# Patient Record
Sex: Female | Born: 1965 | Race: White | Hispanic: No | Marital: Married | Smoking: Never smoker
Health system: Southern US, Community
[De-identification: ages and names within clinical notes are randomized; demographics above are authoritative.]

## PROBLEM LIST (undated history)

## (undated) DIAGNOSIS — F419 Anxiety disorder, unspecified: Secondary | ICD-10-CM

## (undated) DIAGNOSIS — D249 Benign neoplasm of unspecified breast: Secondary | ICD-10-CM

## (undated) HISTORY — PX: WISDOM TOOTH EXTRACTION: SHX21

## (undated) HISTORY — PX: BREAST BIOPSY: SHX20

## (undated) HISTORY — PX: DIAGNOSTIC LAPAROSCOPY: SUR761

## (undated) HISTORY — DX: Anxiety disorder, unspecified: F41.9

## (undated) HISTORY — PX: PELVIC LAPAROSCOPY: SHX162

## (undated) HISTORY — PX: BREAST EXCISIONAL BIOPSY: SUR124

## (undated) HISTORY — DX: Benign neoplasm of unspecified breast: D24.9

---

## 1998-05-08 DIAGNOSIS — D249 Benign neoplasm of unspecified breast: Secondary | ICD-10-CM

## 1998-05-08 HISTORY — PX: BREAST FIBROADENOMA SURGERY: SHX580

## 1998-05-08 HISTORY — DX: Benign neoplasm of unspecified breast: D24.9

## 2004-10-12 ENCOUNTER — Ambulatory Visit: Payer: Self-pay | Admitting: Obstetrics and Gynecology

## 2006-01-31 ENCOUNTER — Ambulatory Visit: Payer: Self-pay | Admitting: Obstetrics and Gynecology

## 2006-08-15 ENCOUNTER — Ambulatory Visit: Payer: Self-pay | Admitting: Internal Medicine

## 2006-08-22 ENCOUNTER — Ambulatory Visit: Payer: Self-pay | Admitting: Internal Medicine

## 2007-02-06 ENCOUNTER — Ambulatory Visit: Payer: Self-pay | Admitting: Unknown Physician Specialty

## 2007-05-09 HISTORY — PX: CRYOABLATION: SHX1415

## 2007-10-04 ENCOUNTER — Ambulatory Visit: Payer: Self-pay | Admitting: Unknown Physician Specialty

## 2007-10-08 ENCOUNTER — Ambulatory Visit: Payer: Self-pay | Admitting: Unknown Physician Specialty

## 2008-02-20 ENCOUNTER — Ambulatory Visit: Payer: Self-pay | Admitting: Unknown Physician Specialty

## 2009-02-24 ENCOUNTER — Ambulatory Visit: Payer: Self-pay | Admitting: Unknown Physician Specialty

## 2010-03-15 ENCOUNTER — Ambulatory Visit: Payer: Self-pay | Admitting: Unknown Physician Specialty

## 2010-11-28 ENCOUNTER — Ambulatory Visit: Payer: Self-pay | Admitting: Unknown Physician Specialty

## 2010-12-06 ENCOUNTER — Ambulatory Visit: Payer: Self-pay | Admitting: Surgery

## 2010-12-08 LAB — PATHOLOGY REPORT

## 2011-07-20 ENCOUNTER — Ambulatory Visit: Payer: Self-pay | Admitting: Surgery

## 2013-12-02 ENCOUNTER — Ambulatory Visit (INDEPENDENT_AMBULATORY_CARE_PROVIDER_SITE_OTHER): Payer: PRIVATE HEALTH INSURANCE | Admitting: General Surgery

## 2013-12-02 ENCOUNTER — Encounter: Payer: Self-pay | Admitting: General Surgery

## 2013-12-02 ENCOUNTER — Other Ambulatory Visit: Payer: PRIVATE HEALTH INSURANCE

## 2013-12-02 VITALS — BP 130/90 | HR 74 | Resp 14 | Ht 68.0 in | Wt 185.0 lb

## 2013-12-02 DIAGNOSIS — N63 Unspecified lump in unspecified breast: Secondary | ICD-10-CM

## 2013-12-02 DIAGNOSIS — D241 Benign neoplasm of right breast: Secondary | ICD-10-CM

## 2013-12-02 DIAGNOSIS — D249 Benign neoplasm of unspecified breast: Secondary | ICD-10-CM

## 2013-12-02 NOTE — Progress Notes (Signed)
Patient ID: Vanessa Hopkins, female   DOB: Vanessa Hopkins 15, 1967, 48 y.o.   MRN: [id number]  Chief Complaint  Patient presents with  . Other    New Patient evaluation of right breast mass    HPI Vanessa Hopkins is a 48 y.o. female who presents for an evaluation of a right breast mass. The patient's last mammogram was done at [facility] in November 2014. The mass was noticed approximately 4 years ago. Dr. Rochel Brome and the patient have kept an eye on this yearly. This area was biopsied in 2012 and was benign.. She has had a fibroadenoma removed from another area in the right breast previously. No known family history of breast problems. The patient does do self breast checks regularly and gets regular mammograms. She does complain of tenderness that is described as a needle prick on occasion in the area of the known fibroadenoma, and it was cyst change that had prompted her to present for reassessment. The mass is located in the upper central quadrant of the right breast. She believes that it has gotten larger. The area feels flat in shape.   HPI  Past Medical History  Diagnosis Date  . Fibroadenoma of breast 2000    Past Surgical History  Procedure Laterality Date  . Breast fibroadenoma surgery Right 2000  . Cryoablation  2009  . Breast biopsy Right 2012?  Marland Kitchen Diagnostic laparoscopy  2012?    History reviewed. No pertinent family history.  Social History History  Substance Use Topics  . Smoking status: Never Smoker   . Smokeless tobacco: Not on file  . Alcohol Use: Yes    No Known Allergies  Current Outpatient Prescriptions  Medication Sig Dispense Refill  . B Complex Vitamins (VITAMIN B COMPLEX PO) Take 1 tablet by mouth daily.      Cyndie Chime Estrad-Fe Biphas (LO LOESTRIN FE PO) Take by mouth.      . Omega-3 Fatty Acids (FISH OIL PO) Take by mouth.      Marland Kitchen VITAMIN D, ERGOCALCIFEROL, PO Take 1 capsule by mouth daily.       No current facility-administered medications for this visit.     Review of Systems Review of Systems  Constitutional: Negative.   Respiratory: Negative.   Cardiovascular: Negative.     Blood pressure 130/90, pulse 74, resp. rate 14, height 5\' 8"  (1.727 m), weight 185 lb (83.915 kg).  Physical Exam Physical Exam  Constitutional: She is oriented to person, place, and time. She appears well-developed and well-nourished.  Neck: Neck supple. No thyromegaly present.  Cardiovascular: Normal rate, regular rhythm and normal heart sounds.   No murmur heard. Pulmonary/Chest: Effort normal and breath sounds normal. Right breast exhibits mass. Right breast exhibits no inverted nipple, no nipple discharge, no skin change and no tenderness. Left breast exhibits no inverted nipple, no mass, no nipple discharge, no skin change and no tenderness.  12 o'clock right breast 5 cm from nipple 3 cm thickening.   Lymphadenopathy:    She has no cervical adenopathy.    She has no axillary adenopathy.  Neurological: She is alert and oriented to person, place, and time.  Skin: Skin is warm and dry.    Data Reviewed Bilateral mammograms completed at [place] dated 03/12/2013 reported dense breast. The previously noted nodular opacity in the right breast was unchanged. BI-RAD-2.  Ultrasound examination of the right breast at the 12:00 position 5 cm from the nipple showed a 1.35 x 1.71 x 2.49 cm smoothly marginated  hypoechoic mass with acoustic enhancement. Clear edge affect. No internal vascular flow. This is consistent with a fibroadenoma. BI-RAD-2.  Outside ultrasound completed in September 2013 show the area measuring 1.35 x 2.14 x 2.4 to and in July 2012 1.1 x 1.9 x 2.2 cm. Minimal interval change post 2012 biopsy.  Assessment    Benign breast exam. Stable fibroadenoma.    Plan    The staining pain reported by the patient [place] or [place] not be related to the fibroadenoma. If fairly modest at present and does not warrant formal excision. The patient should  continue annual screening mammograms with her gynecologist. Should she have worsening symptoms or increasing local discomfort excision or cryotherapy could be considered.      PCP: No Pcp Per Patient Ref. MD: Dr. Percell Boston, [place] 12/04/2013, 12:06 PM

## 2013-12-02 NOTE — Patient Instructions (Signed)
Patient to return in as needed. The patient is aware to call back for any questions or concerns.

## 2013-12-04 DIAGNOSIS — D249 Benign neoplasm of unspecified breast: Secondary | ICD-10-CM | POA: Insufficient documentation

## 2014-03-09 ENCOUNTER — Encounter: Payer: Self-pay | Admitting: General Surgery

## 2014-12-04 ENCOUNTER — Encounter: Payer: Self-pay | Admitting: Certified Nurse Midwife

## 2014-12-04 ENCOUNTER — Ambulatory Visit (INDEPENDENT_AMBULATORY_CARE_PROVIDER_SITE_OTHER): Payer: PRIVATE HEALTH INSURANCE | Admitting: Certified Nurse Midwife

## 2014-12-04 VITALS — BP 122/70 | HR 70 | Resp 20 | Ht 67.25 in | Wt 185.0 lb

## 2014-12-04 DIAGNOSIS — Z124 Encounter for screening for malignant neoplasm of cervix: Secondary | ICD-10-CM | POA: Diagnosis not present

## 2014-12-04 DIAGNOSIS — Z0000 Encounter for general adult medical examination without abnormal findings: Secondary | ICD-10-CM | POA: Diagnosis not present

## 2014-12-04 DIAGNOSIS — N6310 Unspecified lump in the right breast, unspecified quadrant: Secondary | ICD-10-CM

## 2014-12-04 DIAGNOSIS — N63 Unspecified lump in breast: Secondary | ICD-10-CM

## 2014-12-04 DIAGNOSIS — Z01419 Encounter for gynecological examination (general) (routine) without abnormal findings: Secondary | ICD-10-CM

## 2014-12-04 NOTE — Progress Notes (Signed)
49 y.o. G42P4 Married  Caucasian Fe here to establish gyn care and  for annual exam. Patient had ablation for menorrhagia and prolonged bleeding. No bleeding at all since ablation. Patient has not any menses like symptoms. Previous Gyn in [place] has retired. Was started on Loloestrin for menopausal symptoms, she feels better, with using OCP. Had [facility] with previous MD and menopausal was noted. Patient desires fasting labs, but would like to schedule to come in for. patient has history of fibroadenoma, one removed. Has one in right breast with last follow up ? 2014. She feels has enlarged. Has not established with PCP, uses Urgent care. Returning to career with teaching soon. Patient concerned with OCP instead of HRT. Friends have discussed with her patch use is better. Doing well with  Her routine. No other health issues today.  No LMP recorded. Patient has had an ablation.          Sexually active: Yes.    The current method of family planning is ablation.    Exercising: Yes.    low impact cardio, treadmill, eliptical, bike Smoker:  no  Health Maintenance: Pap:  2015 neg  MMG:  11/14 then u/s done  Has fibroadenoma that is being followed. Colonoscopy:  none BMD:   none TDaP:  Within 54yrs Labs: none Self breast exam: done monthly   reports that she has never smoked. She does not have any smokeless tobacco history on file. She reports that she drinks about 1.8 - 3.0 oz of alcohol per week. She reports that she does not use illicit drugs.  Past Medical History  Diagnosis Date  . Fibroadenoma of breast 2000    Past Surgical History  Procedure Laterality Date  . Breast fibroadenoma surgery Right 2000  . Cryoablation  2009  . Breast biopsy Right 2012?  Marland Kitchen Diagnostic laparoscopy  2012?    Current Outpatient Prescriptions  Medication Sig Dispense Refill  . B Complex Vitamins (VITAMIN B COMPLEX PO) Take 1 tablet by mouth daily.    . Multiple Vitamins-Minerals (MULTIVITAMIN PO) Take by  mouth as needed.    Cyndie Chime Estrad-Fe Biphas (LO LOESTRIN FE PO) Take by mouth every other day.     . Omega-3 Fatty Acids (FISH OIL PO) Take by mouth.    Marland Kitchen VITAMIN D, ERGOCALCIFEROL, PO Take 1 capsule by mouth daily.     No current facility-administered medications for this visit.    History reviewed. No pertinent family history.  ROS:  Pertinent items are noted in HPI.  Otherwise, a comprehensive ROS was negative.  Exam:   BP 122/70 mmHg  Pulse 70  Resp 20  Ht 5' 7.25" (1.708 m)  Wt 185 lb (83.915 kg)  BMI 28.76 kg/m2  LMP  Height: 5' 7.25" (170.8 cm) Ht Readings from Last 3 Encounters:  12/04/14 5' 7.25" (1.708 m)  12/02/13 5\' 8"  (1.727 m)    General appearance: alert, cooperative and appears stated age Head: Normocephalic, without obvious abnormality, atraumatic Neck: no adenopathy, supple, symmetrical, trachea midline and thyroid normal to inspection and palpation Lungs: clear to auscultation bilaterally Breasts: normal appearance, no masses or tenderness, No nipple retraction or dimpling, No nipple discharge or bleeding, No axillary or supraclavicular adenopathy, positive findings: mass in right breast at 12 o'clock at aerola edge, mobile 3 cm. history of fibroademoma Heart: regular rate and rhythm Abdomen: soft, non-tender; no masses,  no organomegaly Extremities: extremities normal, atraumatic, no cyanosis or edema Skin: Skin color, texture, turgor normal. No rashes or  lesions Lymph nodes: Cervical, supraclavicular, and axillary nodes normal. No abnormal inguinal nodes palpated Neurologic: Grossly normal   Pelvic: External genitalia:  no lesions              Urethra:  normal appearing urethra with no masses, tenderness or lesions              Bartholin's and Skene's: normal                 Vagina: normal appearing vagina with normal color and discharge, no lesions              Cervix: normal,non tender,no lesions              Pap taken: Yes.   Bimanual Exam:   Uterus:  normal size, contour, position, consistency, mobility, non-tender and mid position              Adnexa: normal adnexa and no mass, fullness, tenderness               Rectovaginal: Confirms               Anus:  normal sphincter tone, no lesions  Chaperone present: Yes  A:  Well Woman with normal exam  Perimenopausal on OCP   History of ablation for prolonged cycles  Right breast mass history of fibroadenoma with removal  Schedule fasting labs  Obtain previous records for  Menopausal evaluation  P:   Reviewed health and wellness pertinent to exam  Discussed etiology of menopause and bleeding expectations, patient has been? Perimenopausal for 2 years with OCP use for symptom relief, does not use consistently. Discussed would not renew at this point due to breast finding and with not consistent use should not have problems being off. Patient has requested records from other provider assess labs done. Declines labs today. Will advise if vaginal bleeding. Once all in will evaluate if she needs OCP vs HRT. Patient excited about this being discussed more in depth, just not a RX. Agreeable.  Discussed finding and need for evaluation, patient aware as she has had this done prior. Will schedule today. Questions addressed.  Labs: Lipid panel, TSH,Vit. D  Pap smear taken with [facility]   counseled on breast self exam, mammography screening, use and side effects of HRT, adequate intake of calcium and vitamin D, diet and exercise  return annually or prn  An After Visit Summary was printed and given to the patient.

## 2014-12-04 NOTE — Progress Notes (Signed)
Scheduled patient while in office for bilateral diagnostic with right breast ultrasound on 12/09/2014 at 1:[facility].

## 2014-12-04 NOTE — Patient Instructions (Signed)

## 2014-12-08 ENCOUNTER — Ambulatory Visit: Payer: PRIVATE HEALTH INSURANCE | Admitting: Certified Nurse Midwife

## 2014-12-09 ENCOUNTER — Ambulatory Visit
Admission: RE | Admit: 2014-12-09 | Discharge: 2014-12-09 | Disposition: A | Discharge status: Home / Self Care | Payer: PRIVATE HEALTH INSURANCE | Source: Ambulatory Visit | Attending: Certified Nurse Midwife | Admitting: Certified Nurse Midwife

## 2014-12-09 DIAGNOSIS — N6310 Unspecified lump in the right breast, unspecified quadrant: Secondary | ICD-10-CM

## 2014-12-09 LAB — IPS PAP TEST WITH HPV

## 2014-12-09 NOTE — Progress Notes (Signed)
Reviewed personally.  M. Suzanne Torrell Krutz, MD.  

## 2014-12-14 ENCOUNTER — Encounter: Payer: Self-pay | Admitting: Certified Nurse Midwife

## 2014-12-15 ENCOUNTER — Telehealth: Payer: Self-pay

## 2014-12-15 NOTE — Telephone Encounter (Signed)
To: Milas Gain Cuneo   From: Jasmine Awe, RN   Created: 12/15/2014 10:01 AM    Enid Derry,  I am one of the triage nurses here in the office with Regina Eck CNM. If you could please bring the form to your next appointment that would be great. This way we will be able to look it over and see what it requires. As long as it has requirements that we are able to perform in the office we will be able to complete this for you. I will let Regina Eck CNM know you will be bringing this with you to your upcoming appointment for her review. If you need anything prior to that appointment or have any questions please feel free to call our office at [phone].  Thank you, Rolla Etienne, RN

## 2014-12-15 NOTE — Telephone Encounter (Signed)
Non-Urgent Medical Question  Message [id number]   From  [name]   To  Regina Eck, CNM   Sent  12/14/2014 2:11 PM     I meant to ask the doctor to fill out a paper stating I had a physical. I'm due to see her again soon to go over blood work that I will be having on 8-15, and to go over test results. So if you could please make a note, and if there is anything else that she needs from me for the physical.   Thank you so much!

## 2014-12-15 NOTE — Telephone Encounter (Signed)
Please see telephone encounter dated 12/15/2014.

## 2014-12-15 NOTE — Telephone Encounter (Signed)
Spoke with patient regarding mychart message below. Advised patient we would be happy to fill out her health form dependent on its requirements. Advised this will need to be reviewed and as long as we are able to perform the needs in the office we can fill this out. Patient will bring this to her lab appointment on 12/21/2014. Offered to schedule appointment to meet with Regina Eck CNM to go over results per [facility] message. Advised we will give her a call to go over these results and any further recommendations. Patient would like to wait for results and schedule a follow up appointment if needed.  Routing to provider for final review. Patient agreeable to disposition. Will close encounter.   Patient aware provider will review message and nurse will return call if any additional advice or change of disposition.

## 2014-12-15 NOTE — Telephone Encounter (Signed)
Mychart message as seen below sent to patient regarding her health form. Telephone call also opened to discuss this with patient.

## 2014-12-21 ENCOUNTER — Other Ambulatory Visit (INDEPENDENT_AMBULATORY_CARE_PROVIDER_SITE_OTHER): Payer: PRIVATE HEALTH INSURANCE

## 2014-12-21 DIAGNOSIS — Z0000 Encounter for general adult medical examination without abnormal findings: Secondary | ICD-10-CM

## 2014-12-21 LAB — LIPID PANEL
CHOL/HDL RATIO: 3.7 ratio (ref <=5.0)
Cholesterol: 215 mg/dL — ABNORMAL HIGH (ref 125–200)
HDL: 58 mg/dL (ref >=46)
LDL Cholesterol: 132 mg/dL — ABNORMAL HIGH (ref <130)
Triglycerides: 125 mg/dL (ref <150)
VLDL: 25 mg/dL (ref <30)

## 2014-12-21 LAB — TSH: TSH: 2.29 u[IU]/mL (ref 0.350–4.500)

## 2014-12-21 LAB — VITAMIN D 25 HYDROXY (VIT D DEFICIENCY, FRACTURES): Vit D, 25-Hydroxy: 27 ng/mL — ABNORMAL LOW (ref 30–100)

## 2014-12-22 ENCOUNTER — Encounter: Payer: Self-pay | Admitting: Certified Nurse Midwife

## 2014-12-23 ENCOUNTER — Other Ambulatory Visit: Payer: Self-pay | Admitting: Certified Nurse Midwife

## 2014-12-23 ENCOUNTER — Telehealth: Payer: Self-pay

## 2014-12-23 DIAGNOSIS — N951 Menopausal and female climacteric states: Secondary | ICD-10-CM

## 2014-12-23 MED ORDER — NORETHIN-ETH ESTRAD-FE BIPHAS 1 MG-10 MCG / 10 MCG PO TABS: 1.0000 | ORAL_TABLET | Freq: Every day | ORAL | Status: DC

## 2014-12-23 NOTE — Telephone Encounter (Signed)
She was to be on the placebo pills for [place] to draw her FSH/AMH and to let us know when that was. Helix not drawn due to no communication regarding. She will need to do this before I can change. Order in for  Will complete form it will be ready by Thursday.

## 2014-12-23 NOTE — Telephone Encounter (Signed)
Spoke with patient. Advised of message as seen below from [place]. Patient states that she is taking Lo Loestrin Fe for menopausal symptoms. Is taking it every other day. "I am not using it at all for birth control. Just to help control my hot flashes." States she was told by a previous doctor to take these continuously and not to take a week off. States last time she came off of the pill "cold [place]" she felt terrible and had a lot of headaches. Patient is about to start a new job and does not want to come off of the pills to have [facility] and [place] checked at this time. Asking if she can continue with taking the Lo Loestrin fe at this time and come in later in the year to have this checked. If so she is requesting refills as she only has 6 pills left of current pack. Advised will need to speak with Regina Eck CNM and return call with further recommendations. Patient is agreeable.

## 2014-12-23 NOTE — Telephone Encounter (Signed)
Yes she can continue. OK to refill

## 2014-12-23 NOTE — Telephone Encounter (Signed)
Spoke with patient. Advised of message as seen below from [place]. Patient is agreeable and verbalizes understanding. Rx for Lo Loestrin Fe #1 11RF until next aex sent to pharmacy on file. Patient is aware that if she would like to have FSH/AMH checked will need to be off OCP for at least one week. Patient is agreeable and will call to schedule if she would like to have these labs checked.  Routing to provider for final review. Patient agreeable to disposition. Will close encounter.   Patient aware provider will review message and nurse will return call if any additional advice or change of disposition.

## 2014-12-23 NOTE — Telephone Encounter (Signed)
Non-Urgent Medical Question  Message [id number]   From  Malasia Torain Ranney   To  Regina Eck, CNM   Sent  12/22/2014 5:21 PM     Hi,  I dropped off a medical report form off today to be filled out by Melvia Heaps. This is for my new job that I will be starting next week. If it can be done by Thursday, that will be great! I have another appointment that day nearby and it will be convenient for me to pick it up. If not, that's okay. I can come get it whenever it's done. Thank you!   Also, I saw my test results on my-chart, and I didn't see anything yet regarding the hormone test Melvia Heaps requested. She wanted to review it and consult with me to see if I should continue taking the Lo Loestrin FE 1-10. If so, I will need her to call in a RX, because I only have a week left of it with no refills. I use [facility] [address]. If she wants me to come in and discuss this, I am free to come in for an appointment. Or, if she wants me to try something else to manage my Menopause symptoms, that's fine, too!   Thanks so much!  Debanhi Gisler  [phone]      Responsible Party    Pool - [facility] Pool No one has taken responsibility for this message.     No actions have been taken on this message.

## 2014-12-23 NOTE — Telephone Encounter (Signed)
Left message to call Frank Pilger at [phone]. 

## 2014-12-23 NOTE — Telephone Encounter (Signed)
Telephone encounter created for review of mychart message with Regina Eck CNM.

## 2014-12-23 NOTE — Telephone Encounter (Signed)
Routing mychart message to Regina Eck CNM for review and advise of results and form provided by patient.

## 2015-07-30 ENCOUNTER — Encounter: Payer: Self-pay | Admitting: Certified Nurse Midwife

## 2015-11-29 ENCOUNTER — Other Ambulatory Visit: Payer: Self-pay | Admitting: Certified Nurse Midwife

## 2015-11-29 NOTE — Telephone Encounter (Signed)
Medication refill request: Lo Loestrin Fe Last AEX:  12/04/14 DL Next AEX: 12/08/15 DL Last MMG (if hormonal medication request): 12/09/14 Dx Bilateral & U/S of R Breast -BIRADS2 Refill authorized: 12/23/14 #1Package 11R. Please advise. Thank you.

## 2015-12-08 ENCOUNTER — Ambulatory Visit: Payer: PRIVATE HEALTH INSURANCE | Admitting: Certified Nurse Midwife

## 2016-02-21 ENCOUNTER — Other Ambulatory Visit: Payer: Self-pay | Admitting: Certified Nurse Midwife

## 2016-02-21 DIAGNOSIS — Z1231 Encounter for screening mammogram for malignant neoplasm of breast: Secondary | ICD-10-CM

## 2016-03-06 ENCOUNTER — Ambulatory Visit
Admission: RE | Admit: 2016-03-06 | Discharge: 2016-03-06 | Disposition: A | Discharge status: Home / Self Care | Payer: PRIVATE HEALTH INSURANCE | Source: Ambulatory Visit | Attending: Certified Nurse Midwife | Admitting: Certified Nurse Midwife

## 2016-03-06 DIAGNOSIS — Z1231 Encounter for screening mammogram for malignant neoplasm of breast: Secondary | ICD-10-CM

## 2016-03-09 ENCOUNTER — Other Ambulatory Visit: Payer: Self-pay | Admitting: Certified Nurse Midwife

## 2016-03-09 DIAGNOSIS — R928 Other abnormal and inconclusive findings on diagnostic imaging of breast: Secondary | ICD-10-CM

## 2016-03-17 ENCOUNTER — Ambulatory Visit
Admission: RE | Admit: 2016-03-17 | Discharge: 2016-03-17 | Disposition: A | Discharge status: Home / Self Care | Payer: PRIVATE HEALTH INSURANCE | Source: Ambulatory Visit | Attending: Certified Nurse Midwife | Admitting: Certified Nurse Midwife

## 2016-03-17 ENCOUNTER — Other Ambulatory Visit: Payer: PRIVATE HEALTH INSURANCE

## 2016-03-17 DIAGNOSIS — R928 Other abnormal and inconclusive findings on diagnostic imaging of breast: Secondary | ICD-10-CM

## 2016-10-11 ENCOUNTER — Ambulatory Visit (INDEPENDENT_AMBULATORY_CARE_PROVIDER_SITE_OTHER): Payer: PRIVATE HEALTH INSURANCE | Admitting: Certified Nurse Midwife

## 2016-10-11 ENCOUNTER — Encounter: Payer: Self-pay | Admitting: Certified Nurse Midwife

## 2016-10-11 ENCOUNTER — Other Ambulatory Visit (HOSPITAL_COMMUNITY)
Admission: RE | Admit: 2016-10-11 | Discharge: 2016-10-11 | Disposition: A | Discharge status: Home / Self Care | Payer: PRIVATE HEALTH INSURANCE | Source: Ambulatory Visit | Attending: Certified Nurse Midwife | Admitting: Certified Nurse Midwife

## 2016-10-11 VITALS — BP 118/80 | HR 70 | Resp 16 | Ht 67.0 in | Wt 176.0 lb

## 2016-10-11 DIAGNOSIS — N951 Menopausal and female climacteric states: Secondary | ICD-10-CM

## 2016-10-11 DIAGNOSIS — Z01419 Encounter for gynecological examination (general) (routine) without abnormal findings: Secondary | ICD-10-CM | POA: Diagnosis not present

## 2016-10-11 DIAGNOSIS — Z124 Encounter for screening for malignant neoplasm of cervix: Secondary | ICD-10-CM | POA: Insufficient documentation

## 2016-10-11 DIAGNOSIS — Z0000 Encounter for general adult medical examination without abnormal findings: Secondary | ICD-10-CM | POA: Diagnosis not present

## 2016-10-11 NOTE — Patient Instructions (Signed)

## 2016-10-11 NOTE — Progress Notes (Signed)
51 y.o. G69P4 Married  Caucasian Fe here for annual exam. Periods none with ablation. No sensation of period onset. Has stopped the Lo loestrin Fe 1/20, because precious PCP evaluated hormone levels for fertility and she was showing infertility. Does not want to continue but would consider HRT if menopausal for help with symptoms.  ? Menopausal. Occasional hot flash or night sweats and PMS episodes. Will not see PCP for a few months and would labs here. Would also like to schedule fasting labs. No other health issues today.   No LMP recorded. Patient has had an ablation.          Sexually active: Yes.    The current method of family planning is none.  Pt had ablation  Exercising: Yes.    walking Smoker:  no  Health Maintenance: Pap:  2015 neg, 12-04-14 neg HPV HR neg History of Abnormal Pap: no MMG:  Bilateral 03-06-16, rt breast diagnostic 11/17 category c density birads 1:neg Self Breast exams: no Colonoscopy:  none BMD:   none TDaP:  Within 51yrs, pt to check with pcp maybe 2016 Shingles: no Pneumonia: no Hep C and HIV: HIV has been done Labs: none   reports that she has never smoked. She has never used smokeless tobacco. She reports that she drinks about 1.8 oz of alcohol per week . She reports that she does not use drugs.  Past Medical History:  Diagnosis Date  . Anxiety   . Fibroadenoma of breast 2000    Past Surgical History:  Procedure Laterality Date  . BREAST BIOPSY Right 2012?  Marland Kitchen BREAST BIOPSY Right    pin in place  . BREAST FIBROADENOMA SURGERY Right 2000  . CRYOABLATION  2009  . DIAGNOSTIC LAPAROSCOPY  2012?  . PELVIC LAPAROSCOPY    . WISDOM TOOTH EXTRACTION      Current Outpatient Prescriptions  Medication Sig Dispense Refill  . B Complex Vitamins (VITAMIN B COMPLEX PO) Take 1 tablet by mouth daily.    . LO LOESTRIN FE 1 MG-10 MCG / 10 MCG tablet TAKE 1 TABLET BY MOUTH DAILY. 28 tablet 0  . Multiple Vitamins-Minerals (MULTIVITAMIN PO) Take by mouth as needed.     . Omega-3 Fatty Acids (FISH OIL PO) Take by mouth.    Marland Kitchen VITAMIN D, ERGOCALCIFEROL, PO Take 1 capsule by mouth daily.     No current facility-administered medications for this visit.     Family History  Problem Relation Age of Onset  . Heart murmur Mother   . Kidney cancer Father   . Diabetes Sister   . Migraines Sister   . Cervical cancer Maternal Grandmother   . Heart attack Maternal Grandfather     ROS:  Pertinent items are noted in HPI.  Otherwise, a comprehensive ROS was negative.  Exam:   BP 118/80   Pulse 70   Resp 16   Ht 5\' 7"  (1.702 m)   Wt 176 lb (79.8 kg)   BMI 27.57 kg/m  Height: 5\' 7"  (170.2 cm) Ht Readings from Last 3 Encounters:  10/11/16 5\' 7"  (1.702 m)  12/04/14 5' 7.25" (1.708 m)  12/02/13 5\' 8"  (1.727 m)    General appearance: alert, cooperative and appears stated age Head: Normocephalic, without obvious abnormality, atraumatic Neck: no adenopathy, supple, symmetrical, trachea midline and thyroid normal to inspection and palpation Lungs: clear to auscultation bilaterally Breasts: normal appearance, no masses or tenderness, No nipple retraction or dimpling, No nipple discharge or bleeding, No axillary or supraclavicular adenopathy  Heart: regular rate and rhythm Abdomen: soft, non-tender; no masses,  no organomegaly Extremities: extremities normal, atraumatic, no cyanosis or edema Skin: Skin color, texture, turgor normal. No rashes or lesions Lymph nodes: Cervical, supraclavicular, and axillary nodes normal. No abnormal inguinal nodes palpated Neurologic: Grossly normal   Pelvic: External genitalia:  no lesions              Urethra:  normal appearing urethra with no masses, tenderness or lesions              Bartholin's and Skene's: normal                 Vagina: normal appearing vagina with normal color and discharge, no lesions              Cervix: multiparous appearance, no cervical motion tenderness and no lesions              Pap taken:  Yes.   Bimanual Exam:  Uterus:  normal size, contour, position, consistency, mobility, non-tender              Adnexa: normal adnexa and no mass, fullness, tenderness               Rectovaginal: Confirms               Anus:  normal sphincter tone, no lesions  Chaperone present: yes  A:  Well Woman with normal exam  Perimenopausal?  ? immunization due TDAP  Screening labs  Family history of cervical cancer MGM  P:   Reviewed health and wellness pertinent to exam  Discussed perimenopause/menopause, etiology and expectations with symptoms. Discussed HRT to help with symptoms if increase and risks/benefits. Would recommend FSH to assess, but would need to be off her OCP at least 7-10 days for accurate assessment. Patient will schedule future labs and then will discuss once results in.  Patient will check with PCP on Tdap and booster here with labs if needed.  Future labs: FSH,Lipid panel, Vitamin D  Pap smear: yes   counseled on breast self exam, mammography screening, adequate intake of calcium and vitamin D, diet and exercise return annually or prn  An After Visit Summary was printed and given to the patient.

## 2016-10-12 LAB — CYTOLOGY - PAP: DIAGNOSIS: NEGATIVE

## 2016-10-16 ENCOUNTER — Telehealth: Payer: Self-pay | Admitting: Certified Nurse Midwife

## 2016-10-16 ENCOUNTER — Other Ambulatory Visit: Payer: Self-pay | Admitting: Certified Nurse Midwife

## 2016-10-16 DIAGNOSIS — Z23 Encounter for immunization: Secondary | ICD-10-CM

## 2016-10-16 NOTE — Telephone Encounter (Signed)
Patient states that her last tetanus was at 7-10 years ago. She is requesting to get a tetanus when she comes in for labs. Please advise

## 2016-10-16 NOTE — Telephone Encounter (Signed)
Patient states she thinks it Corpuz have been 7 years since her last tetanus shot. She would like to have this done at her fasting lab appointment on 10/18/16.

## 2016-10-16 NOTE — Telephone Encounter (Signed)
I am agreeable for her to to this. Will put order in for day of labs.

## 2016-10-18 ENCOUNTER — Ambulatory Visit: Payer: PRIVATE HEALTH INSURANCE

## 2016-10-18 ENCOUNTER — Ambulatory Visit (INDEPENDENT_AMBULATORY_CARE_PROVIDER_SITE_OTHER): Payer: PRIVATE HEALTH INSURANCE

## 2016-10-18 VITALS — BP 140/82 | HR 68 | Resp 16 | Ht 65.25 in | Wt 178.4 lb

## 2016-10-18 VITALS — BP 140/82 | HR 68 | Resp 16 | Ht 65.25 in | Wt 178.6 lb

## 2016-10-18 DIAGNOSIS — Z0000 Encounter for general adult medical examination without abnormal findings: Secondary | ICD-10-CM

## 2016-10-18 DIAGNOSIS — Z23 Encounter for immunization: Secondary | ICD-10-CM

## 2016-10-18 DIAGNOSIS — N951 Menopausal and female climacteric states: Secondary | ICD-10-CM

## 2016-10-18 NOTE — Progress Notes (Signed)
Patient here for Tdap.  Patient tolerated well. Injection given in R Deltoid.  Routed to provider and encounter closed.

## 2016-10-18 NOTE — Progress Notes (Signed)
Patient here for Tdap and lab work.   Patient tolerated well. Injection given in R Deltoid.  Routed to provider and encounter closed.

## 2016-10-19 ENCOUNTER — Telehealth: Payer: Self-pay

## 2016-10-19 LAB — LIPID PANEL
CHOLESTEROL TOTAL: 225 mg/dL — AB (ref 100–199)
Chol/HDL Ratio: 3 ratio (ref 0.0–4.4)
HDL: 74 mg/dL (ref >39)
LDL CALC: 126 mg/dL — AB (ref 0–99)
Triglycerides: 126 mg/dL (ref 0–149)
VLDL Cholesterol Cal: 25 mg/dL (ref 5–40)

## 2016-10-19 LAB — FOLLICLE STIMULATING HORMONE: FSH: 102.2 m[IU]/mL

## 2016-10-19 LAB — VITAMIN D 25 HYDROXY (VIT D DEFICIENCY, FRACTURES): Vit D, 25-Hydroxy: 43.2 ng/mL (ref 30.0–100.0)

## 2016-10-19 NOTE — Telephone Encounter (Signed)
lmtcb

## 2016-10-19 NOTE — Telephone Encounter (Signed)
-----   Message from Regina Eck, CNM sent at 10/19/2016  7:44 AM EDT ----- Notify patient that Lipid panel shows elevated cholesterol at 225 normal <199 and LDL( harmful cholesterol) at 126 normal <99. HDL is great at 74 which helps with overall profile. Work on decrease in fats, concentrated carbohydrates and work with lean protein and good fiber intake. Recheck at aex or with PCP Vitamin D is normal at 43.2  Continue supplement daily of 1000 IU Vitamin D 3 FSH shows menopausal range She mentioned [place] be interested in HRT. Will need appointment to discuss and initiate if desired.

## 2016-10-20 NOTE — Telephone Encounter (Signed)
Patient is returning a call to Joy. °

## 2016-10-20 NOTE — Telephone Encounter (Signed)
Patient notified of results as written by provider 

## 2016-11-01 ENCOUNTER — Encounter: Payer: Self-pay | Admitting: Certified Nurse Midwife

## 2016-11-01 ENCOUNTER — Ambulatory Visit (INDEPENDENT_AMBULATORY_CARE_PROVIDER_SITE_OTHER): Payer: PRIVATE HEALTH INSURANCE | Admitting: Certified Nurse Midwife

## 2016-11-01 VITALS — BP 122/70 | HR 70 | Resp 16 | Ht 67.0 in | Wt 179.0 lb

## 2016-11-01 DIAGNOSIS — N951 Menopausal and female climacteric states: Secondary | ICD-10-CM | POA: Diagnosis not present

## 2016-11-01 NOTE — Progress Notes (Signed)
51 y.o.  G82P4 Married Caucasian female here to discuss intitation of HRT.  LMP:2009 with slight bleeding after ablation.  Patient reports she's having anxiety at times, hot flashes,night sweats and some mood changes. These were better with using Loestrin every other day. Has stopped use for the past 3 weeks and symptoms are increasing again. d. Also some vaginal dryness, using coconut oil with good results..Patient has read about HRT and has friend who is on HRT and feeling much better. She is interested in for symptom relief. Contraception has been OCP and condoms. No other concerns today.   Past Medical History:  Diagnosis Date  . Anxiety   . Fibroadenoma of breast 2000    Heart disease:  No. DVT history:  No. Breast Cancer:  No. Past history of breast fibroadenoma with one removal due to size benign.  Last Mammogram: 03/17/16 Yes.   negative Last AEX:  10/11/16 all normal Last Pap smear:  10/11/16 negative  The patient has a family history of cervical cancer, MGM late age  Discussed with patient risks and benefits and specifically the WHI study including but not limited to risks of increased risks of heart disease, MI, stroke, DVT, and breast cancer.  Increased risks of gall bladder disease and change in cholesterol panels also discussed.  Possibility of bleeding was discussed as patient does have a uterus.  Benefits of improved quality of life, improved bone density and decreased risks of colon cancer also discussed.    Recent lab work:  [place]: 102.2        TSH: 2.290         [facility]: drawn today  Assessment:  Symptomatic menopausal symptoms  Plan:  Discussed with patient that if AMH is in infertility range can transition from OCP to  HRT. Discussed Prempro vs Vivelle Dot/Prometrium use. Benefits/risks discussed. Questions addressed.   Pt will start on HRT Vivelle Dot 0.05 and Prometrium 200 mg if AMH is infertility range. Patient will be notified of lab results and .  Rx sent to pharmacy as  indicated.  Pt aware to all if has any bleeding or symptoms/side effects and aware to advise if occurs. Discussed HRT use only for symptomatic relief during menopause and would transition off as indicated.  Will plan recheck in 1 month as starting..  ~30 minutes spent with patient >50% of time was in face to face discussion of above.

## 2016-11-01 NOTE — Patient Instructions (Signed)

## 2016-11-04 LAB — ANTI MULLERIAN HORMONE

## 2016-11-06 ENCOUNTER — Telehealth: Payer: Self-pay | Admitting: Certified Nurse Midwife

## 2016-11-06 NOTE — Telephone Encounter (Signed)
Patient calling for recent test results as she is waiting to start hormone replacement therapy.

## 2016-11-06 NOTE — Telephone Encounter (Signed)
Left detailed message, ok per current dpr. Advised patient Vanessa Hopkins, CNM is out of the office today, will return call once labs reviewed on 7/3. Return call to [phone] for any additional questions.     Vanessa Hopkins, CN-can you review AMH results dated 11/01/16 and advise?

## 2016-11-09 ENCOUNTER — Other Ambulatory Visit: Payer: Self-pay | Admitting: Certified Nurse Midwife

## 2016-11-09 DIAGNOSIS — N951 Menopausal and female climacteric states: Secondary | ICD-10-CM

## 2016-11-09 MED ORDER — PROGESTERONE MICRONIZED 200 MG PO CAPS: ORAL_CAPSULE | ORAL | 2 refills | Status: DC

## 2016-11-09 MED ORDER — ESTRADIOL 0.05 MG/24HR TD PTTW: 1.0000 | MEDICATED_PATCH | TRANSDERMAL | 2 refills | Status: DC

## 2016-11-09 NOTE — Telephone Encounter (Signed)
Spoke with patient. Advised of results as seen below form Vanessa Hopkins CNM patient verbalizes understanding. 3 month recheck scheduled for 02/02/2017 at 10 am with Vanessa Hopkins CNM. Patient is agreeable to date and time.  Routing to provider for final review. Patient agreeable to disposition. Will close encounter.

## 2016-11-09 NOTE — Telephone Encounter (Signed)
Left message to call [place] at [phone].  Notes recorded by Regina Eck, CNM on 11/09/2016 at 7:56 AM EDT Notify patient that [facility] is infertility range Will start her on Vivelle Dot twice weekly as discussed and Prometrium 200 mg daily. Advise when patient notified, orders pended. Patient should re-review warning signs again as discussed on drug insert and advise if occurs. Needs 3 months OV to evaluate or sooner if problems. Please scheduled.

## 2016-11-09 NOTE — Telephone Encounter (Signed)
Results reviewed, patient to be called today with recommendations. See result  note

## 2016-12-01 ENCOUNTER — Ambulatory Visit: Payer: PRIVATE HEALTH INSURANCE | Admitting: Certified Nurse Midwife

## 2017-01-21 ENCOUNTER — Other Ambulatory Visit: Payer: Self-pay | Admitting: Certified Nurse Midwife

## 2017-01-21 DIAGNOSIS — N951 Menopausal and female climacteric states: Secondary | ICD-10-CM

## 2017-01-22 NOTE — Telephone Encounter (Signed)
Medication refill request: Vivelle Patch  Last AEX:  10-11-16  Next AEX: 10-12-17  Last MMG (if hormonal medication request): 03-17-16 WNL Refill authorized: please advise

## 2017-01-22 NOTE — Telephone Encounter (Signed)
Patient was to come in at 3 months to assess status

## 2017-01-23 NOTE — Telephone Encounter (Signed)
Patient is scheduled for 02-02-17 for follow up

## 2017-01-29 ENCOUNTER — Other Ambulatory Visit: Payer: Self-pay | Admitting: Certified Nurse Midwife

## 2017-01-29 DIAGNOSIS — N951 Menopausal and female climacteric states: Secondary | ICD-10-CM

## 2017-01-29 NOTE — Telephone Encounter (Signed)
Medication refill request: Progesterone Last AEX:  10-11-16  Next AEX: 10-12-17  Last MMG (if hormonal medication request): 03-17-16 WNL  Refill authorized: Please advise

## 2017-02-02 ENCOUNTER — Encounter: Payer: Self-pay | Admitting: Certified Nurse Midwife

## 2017-02-02 ENCOUNTER — Ambulatory Visit (INDEPENDENT_AMBULATORY_CARE_PROVIDER_SITE_OTHER): Payer: PRIVATE HEALTH INSURANCE | Admitting: Certified Nurse Midwife

## 2017-02-02 ENCOUNTER — Ambulatory Visit: Payer: PRIVATE HEALTH INSURANCE | Admitting: Certified Nurse Midwife

## 2017-02-02 VITALS — BP 112/80 | HR 72 | Resp 16 | Ht 67.0 in | Wt 188.0 lb

## 2017-02-02 DIAGNOSIS — N898 Other specified noninflammatory disorders of vagina: Secondary | ICD-10-CM | POA: Diagnosis not present

## 2017-02-02 DIAGNOSIS — N951 Menopausal and female climacteric states: Secondary | ICD-10-CM | POA: Diagnosis not present

## 2017-02-02 DIAGNOSIS — Z79899 Other long term (current) drug therapy: Secondary | ICD-10-CM

## 2017-02-02 MED ORDER — PROGESTERONE MICRONIZED 200 MG PO CAPS: 200.0000 mg | ORAL_CAPSULE | Freq: Every day | ORAL | 4 refills | Status: DC

## 2017-02-02 MED ORDER — ESTRADIOL 0.05 MG/24HR TD PTTW: 1.0000 | MEDICATED_PATCH | TRANSDERMAL | 4 refills | Status: DC

## 2017-02-02 NOTE — Progress Notes (Signed)
51 y.o.Married Caucasian female G4P4 here for follow-up of Menopausal symptoms being treated with Vivelle dot and Prometrium   Initiated 11/01/16.   Denies nausea, headache, vaginal bleeding or DVT warning signs or other medication side effects. Reports no crying,or significant hot flashes now. Some insomnia at times but related more to busy schedule. Denies warning signs of HRT use. Using coconut oil for vaginal dryness prn. Feels this is much better. Happy with choice to start on HRT. No other concerns today.  O:Healthy WD,WN female, appropriately dressed    Affect : Appropriate    A:Menopausal symptoms responding well to Vivelle dot/Prometrium use Vaginal dryness improved with coconut oil use  P:1- Continue medication as prescribed, but move prometrium use to pm to see if helps with sleep pattern. [place] try Melatonin if needed. Questions addressed. Aware to advise if warning signs with HRT use. Schedule mammogram when due. Rv prn, aex

## 2017-02-02 NOTE — Patient Instructions (Signed)

## 2017-02-19 ENCOUNTER — Other Ambulatory Visit: Payer: Self-pay | Admitting: Certified Nurse Midwife

## 2017-02-19 DIAGNOSIS — N951 Menopausal and female climacteric states: Secondary | ICD-10-CM

## 2017-06-04 ENCOUNTER — Other Ambulatory Visit: Payer: Self-pay | Admitting: Certified Nurse Midwife

## 2017-06-04 DIAGNOSIS — Z139 Encounter for screening, unspecified: Secondary | ICD-10-CM

## 2017-06-22 ENCOUNTER — Ambulatory Visit
Admission: RE | Admit: 2017-06-22 | Discharge: 2017-06-22 | Disposition: A | Discharge status: Home / Self Care | Payer: Self-pay | Source: Ambulatory Visit | Attending: Certified Nurse Midwife | Admitting: Certified Nurse Midwife

## 2017-06-22 DIAGNOSIS — Z139 Encounter for screening, unspecified: Secondary | ICD-10-CM

## 2017-07-05 ENCOUNTER — Other Ambulatory Visit: Payer: Self-pay | Admitting: Certified Nurse Midwife

## 2017-07-05 DIAGNOSIS — N951 Menopausal and female climacteric states: Secondary | ICD-10-CM

## 2017-07-05 NOTE — Telephone Encounter (Signed)
Medication refill request: Vivelle Patch  Last AEX:  10-11-16  Next AEX: 10-12-17  Last MMG (if hormonal medication request): 06-22-17 WNL  Refill authorized: please advise

## 2017-08-26 ENCOUNTER — Other Ambulatory Visit: Payer: Self-pay | Admitting: Certified Nurse Midwife

## 2017-08-26 DIAGNOSIS — N951 Menopausal and female climacteric states: Secondary | ICD-10-CM

## 2017-09-28 ENCOUNTER — Other Ambulatory Visit: Payer: Self-pay | Admitting: Certified Nurse Midwife

## 2017-09-28 ENCOUNTER — Encounter: Payer: Self-pay | Admitting: Certified Nurse Midwife

## 2017-09-28 DIAGNOSIS — N951 Menopausal and female climacteric states: Secondary | ICD-10-CM

## 2017-09-28 MED ORDER — ESTRADIOL 0.05 MG/24HR TD PTTW: 1.0000 | MEDICATED_PATCH | TRANSDERMAL | Status: DC

## 2017-09-28 MED ORDER — PROGESTERONE MICRONIZED 200 MG PO CAPS
200.0000 mg | ORAL_CAPSULE | Freq: Every day | ORAL | 4 refills | Status: DC
Start: 2017-09-28 — End: 2017-11-07

## 2017-09-28 NOTE — Telephone Encounter (Signed)
Patient sent the following correspondence through [place]. Routing to triage to assist patient with request.  ----- Message from [place], Generic sent at 09/28/2017 3:59 PM EDT -----    Since my appointment was rescheduled because Jarvis Newcomer would not be in on June 7th, I will be running out of my prescriptions before Im able to come in on July 3rd. That was the earliest they could get me in after rescheduling me. Its the patch and the progesterone. [facility] [address] [facility] [place].

## 2017-09-28 NOTE — Telephone Encounter (Signed)
Medication refill request: vivelle- dot 0.05  Last AEX:  10/11/16 DL  Next AEX: 11/07/17  Last MMG (if hormonal medication request): 06/22/17 BIRADS 1 negative  Refill authorized: 07/05/17 #8 patch, 6RF. Today, please advise.   Medication refill request: prometrium  Refill authorized: 02/02/17 #30, 4RF. Today, please advise.

## 2017-10-12 ENCOUNTER — Ambulatory Visit: Payer: PRIVATE HEALTH INSURANCE | Admitting: Certified Nurse Midwife

## 2017-11-07 ENCOUNTER — Other Ambulatory Visit: Payer: Self-pay

## 2017-11-07 ENCOUNTER — Ambulatory Visit: Payer: BLUE CROSS/BLUE SHIELD | Admitting: Certified Nurse Midwife

## 2017-11-07 ENCOUNTER — Encounter: Payer: Self-pay | Admitting: Certified Nurse Midwife

## 2017-11-07 VITALS — BP 110/78 | HR 64 | Resp 16 | Ht 66.75 in | Wt 164.0 lb

## 2017-11-07 DIAGNOSIS — N951 Menopausal and female climacteric states: Secondary | ICD-10-CM

## 2017-11-07 DIAGNOSIS — Z0000 Encounter for general adult medical examination without abnormal findings: Secondary | ICD-10-CM

## 2017-11-07 DIAGNOSIS — Z01419 Encounter for gynecological examination (general) (routine) without abnormal findings: Secondary | ICD-10-CM | POA: Diagnosis not present

## 2017-11-07 DIAGNOSIS — E559 Vitamin D deficiency, unspecified: Secondary | ICD-10-CM

## 2017-11-07 DIAGNOSIS — Z79890 Hormone replacement therapy (postmenopausal): Secondary | ICD-10-CM

## 2017-11-07 MED ORDER — ESTRADIOL 0.0375 MG/24HR TD PTTW: MEDICATED_PATCH | TRANSDERMAL | 11 refills | Status: DC

## 2017-11-07 MED ORDER — PROGESTERONE MICRONIZED 200 MG PO CAPS: 200.0000 mg | ORAL_CAPSULE | Freq: Every day | ORAL | 12 refills | Status: DC

## 2017-11-07 NOTE — Progress Notes (Signed)
52 y.o. G60P4 Married  Caucasian Fe here for annual exam.  Menopausal no vaginal bleeding, denies vaginal dryness, hot flashes not an issue or night sweats. Sleep is much better. Desires screening labs. Has been changing diet with decrease in fried foods and has lost weight of 12 pounds. "Feels so much better". HRT working well. Patient felt she was getting too much estrogen so cut her patch in half for one week and noticed slight change, but went back to whole patch with problems. Denies any warning signs with use. Desires continuance, but would like to decrease dosage. No other health issues today.  No LMP recorded. Patient has had an ablation.          Sexually active: Yes.    The current method of family planning is ablation.    Exercising: Yes.    walking Smoker:  no  Health Maintenance: Pap:  12-04-14 neg HPV HR neg, 10-11-16 neg History of Abnormal Pap: no MMG:  06-22-17 category b density birads 1:neg Self Breast exams: yes Colonoscopy:  none BMD:   none TDaP:  6/18 Shingles: no Pneumonia: no Hep C and HIV: not done Labs: if needed   reports that she has never smoked. She has never used smokeless tobacco. She reports that she drinks about 1.2 - 1.8 oz of alcohol per week. She reports that she does not use drugs.  Past Medical History:  Diagnosis Date  . Anxiety   . Fibroadenoma of breast 2000    Past Surgical History:  Procedure Laterality Date  . BREAST BIOPSY Right 2012?  Marland Kitchen BREAST BIOPSY Right    pin in place  . BREAST EXCISIONAL BIOPSY Right    benign  . BREAST FIBROADENOMA SURGERY Right 2000  . CRYOABLATION  2009  . DIAGNOSTIC LAPAROSCOPY  2012?  . PELVIC LAPAROSCOPY    . WISDOM TOOTH EXTRACTION      Current Outpatient Medications  Medication Sig Dispense Refill  . B Complex Vitamins (VITAMIN B COMPLEX PO) Take 1 tablet by mouth daily.    Marland Kitchen CALCIUM PO Take by mouth.    . Cholecalciferol (VITAMIN D PO) Take by mouth.    . CRANBERRY PO Take by mouth as needed.     Marland Kitchen estradiol (VIVELLE-DOT) 0.05 MG/24HR patch Place 1 patch (0.05 mg total) onto the skin 2 (two) times a week. 8 patch 01  . Omega-3 Fatty Acids (FISH OIL PO) Take by mouth.    . Probiotic Product (PROBIOTIC PO) Take by mouth.    . progesterone (PROMETRIUM) 200 MG capsule Take 1 capsule (200 mg total) by mouth daily. 30 capsule 4   No current facility-administered medications for this visit.     Family History  Problem Relation Age of Onset  . Heart murmur Mother   . Kidney cancer Father   . Diabetes Sister   . Migraines Sister   . Cervical cancer Maternal Grandmother   . Heart attack Maternal Grandfather     ROS:  Pertinent items are noted in HPI.  Otherwise, a comprehensive ROS was negative.  Exam:   BP 110/78   Pulse 64   Resp 16   Ht 5' 6.75" (1.695 m)   Wt 164 lb (74.4 kg)   BMI 25.88 kg/m  Height: 5' 6.75" (169.5 cm) Ht Readings from Last 3 Encounters:  11/07/17 5' 6.75" (1.695 m)  02/02/17 5\' 7"  (1.702 m)  11/01/16 5\' 7"  (1.702 m)    General appearance: alert, cooperative and appears stated age Head: Normocephalic,  without obvious abnormality, atraumatic Neck: no adenopathy, supple, symmetrical, trachea midline and thyroid normal to inspection and palpation Lungs: clear to auscultation bilaterally Breasts: normal appearance, no masses or tenderness, No nipple retraction or dimpling, No nipple discharge or bleeding, No axillary or supraclavicular adenopathy Heart: regular rate and rhythm Abdomen: soft, non-tender; no masses,  no organomegaly Extremities: extremities normal, atraumatic, no cyanosis or edema Skin: Skin color, texture, turgor normal. No rashes or lesions Lymph nodes: Cervical, supraclavicular, and axillary nodes normal. No abnormal inguinal nodes palpated Neurologic: Grossly normal   Pelvic: External genitalia:  no lesions              Urethra:  normal appearing urethra with no masses, tenderness or lesions              Bartholin's and Skene's:  normal                 Vagina: normal appearing vagina with normal color and discharge, no lesions              Cervix: no cervical motion tenderness, no lesions and normal appearance              Pap taken: No. Bimanual Exam:  Uterus:  normal size, contour, position, consistency, mobility, non-tender and anteverted              Adnexa: normal adnexa and no mass, fullness, tenderness               Rectovaginal: Confirms               Anus:  normal sphincter tone, no lesions  Chaperone present: yes  A:  Well Woman with normal exam  Menopausal on HRT working well , but would like to decrease dosage  Colonoscopy due  Screening labs  P:   Reviewed health and wellness pertinent to exam.  Discussed risks and benefits/warning signs of HRT and expectations. Desires continuance but would like to decrease dosage.   Rx Minivelle  0.0375 mg see order with instructions  Rx Prometrium 200 mg see order with instructions  Risks/benefits of colonoscopy discussed, declines, given cologard information and will advise if she would like to do this.  Labs: CBC,CMP, Lipid panel, TSH Vitamin d  Pap smear: no   counseled on breast self exam, mammography screening, feminine hygiene, use and side effects of HRT, adequate intake of calcium and vitamin D, diet and exercise  return annually or prn  An After Visit Summary was printed and given to the patient.

## 2017-11-08 ENCOUNTER — Other Ambulatory Visit: Payer: Self-pay | Admitting: Certified Nurse Midwife

## 2017-11-08 DIAGNOSIS — R899 Unspecified abnormal finding in specimens from other organs, systems and tissues: Secondary | ICD-10-CM

## 2017-11-08 LAB — TSH: TSH: 1.69 u[IU]/mL (ref 0.450–4.500)

## 2017-11-08 LAB — LIPID PANEL
CHOLESTEROL TOTAL: 264 mg/dL — AB (ref 100–199)
Chol/HDL Ratio: 3.2 ratio (ref 0.0–4.4)
HDL: 83 mg/dL (ref >39)
LDL Calculated: 157 mg/dL — ABNORMAL HIGH (ref 0–99)
Triglycerides: 122 mg/dL (ref 0–149)
VLDL CHOLESTEROL CAL: 24 mg/dL (ref 5–40)

## 2017-11-08 LAB — COMPREHENSIVE METABOLIC PANEL
ALBUMIN: 4.9 g/dL (ref 3.5–5.5)
ALT: 22 IU/L (ref 0–32)
AST: 24 IU/L (ref 0–40)
Albumin/Globulin Ratio: 1.7 (ref 1.2–2.2)
Alkaline Phosphatase: 52 IU/L (ref 39–117)
BUN/Creatinine Ratio: 11 (ref 9–23)
BUN: 9 mg/dL (ref 6–24)
Bilirubin Total: 0.4 mg/dL (ref 0.0–1.2)
CALCIUM: 9.5 mg/dL (ref 8.7–10.2)
CO2: 22 mmol/L (ref 20–29)
CREATININE: 0.81 mg/dL (ref 0.57–1.00)
Chloride: 102 mmol/L (ref 96–106)
GFR, EST AFRICAN AMERICAN: 97 mL/min/{1.73_m2} (ref >59)
GFR, EST NON AFRICAN AMERICAN: 84 mL/min/{1.73_m2} (ref >59)
GLUCOSE: 89 mg/dL (ref 65–99)
Globulin, Total: 2.9 g/dL (ref 1.5–4.5)
Potassium: 4.1 mmol/L (ref 3.5–5.2)
SODIUM: 140 mmol/L (ref 134–144)
TOTAL PROTEIN: 7.8 g/dL (ref 6.0–8.5)

## 2017-11-08 LAB — CBC
HEMATOCRIT: 43.7 % (ref 34.0–46.6)
Hemoglobin: 14 g/dL (ref 11.1–15.9)
MCH: 31 pg (ref 26.6–33.0)
MCHC: 32 g/dL (ref 31.5–35.7)
MCV: 97 fL (ref 79–97)
Platelets: 295 10*3/uL (ref 150–450)
RBC: 4.52 x10E6/uL (ref 3.77–5.28)
RDW: 13.3 % (ref 12.3–15.4)
WBC: 5.5 10*3/uL (ref 3.4–10.8)

## 2017-11-08 LAB — VITAMIN D 25 HYDROXY (VIT D DEFICIENCY, FRACTURES): VIT D 25 HYDROXY: 54.5 ng/mL (ref 30.0–100.0)

## 2017-11-18 ENCOUNTER — Other Ambulatory Visit: Payer: Self-pay | Admitting: Certified Nurse Midwife

## 2017-11-18 DIAGNOSIS — N951 Menopausal and female climacteric states: Secondary | ICD-10-CM

## 2017-11-19 NOTE — Telephone Encounter (Signed)
Medication refill request: estradiol Last AEX:  11/07/17 Next AEX: 11/14/18 Last MMG (if hormonal medication request): 06/22/17 Bi-rads Category 1 neg Refill authorized: Please refill if appropriate

## 2017-11-20 NOTE — Telephone Encounter (Signed)
Medication was changed at last appt.

## 2018-03-15 ENCOUNTER — Other Ambulatory Visit: Payer: BLUE CROSS/BLUE SHIELD

## 2018-03-29 ENCOUNTER — Other Ambulatory Visit: Payer: BLUE CROSS/BLUE SHIELD

## 2018-04-01 ENCOUNTER — Other Ambulatory Visit (INDEPENDENT_AMBULATORY_CARE_PROVIDER_SITE_OTHER): Payer: BLUE CROSS/BLUE SHIELD

## 2018-04-01 DIAGNOSIS — R899 Unspecified abnormal finding in specimens from other organs, systems and tissues: Secondary | ICD-10-CM

## 2018-04-02 LAB — LIPID PANEL
CHOLESTEROL TOTAL: 221 mg/dL — AB (ref 100–199)
Chol/HDL Ratio: 2.9 ratio (ref 0.0–4.4)
HDL: 77 mg/dL (ref >39)
LDL CALC: 128 mg/dL — AB (ref 0–99)
Triglycerides: 78 mg/dL (ref 0–149)
VLDL Cholesterol Cal: 16 mg/dL (ref 5–40)

## 2018-07-08 ENCOUNTER — Telehealth: Payer: Self-pay | Admitting: Certified Nurse Midwife

## 2018-07-08 NOTE — Telephone Encounter (Signed)
Patient left a message on the answering machine asking for help scheduling her MMG appointment. She said that "our office helped schedule this appointment last year".

## 2018-07-09 NOTE — Telephone Encounter (Signed)
Call to patient.  Advised she can call and schedule screening mammogram at her earliest convenience.  [facility] information provided.  Encounter closed.

## 2018-07-10 ENCOUNTER — Other Ambulatory Visit: Payer: Self-pay | Admitting: Certified Nurse Midwife

## 2018-07-10 DIAGNOSIS — Z1231 Encounter for screening mammogram for malignant neoplasm of breast: Secondary | ICD-10-CM

## 2018-08-01 ENCOUNTER — Other Ambulatory Visit: Payer: Self-pay | Admitting: Certified Nurse Midwife

## 2018-08-01 DIAGNOSIS — N951 Menopausal and female climacteric states: Secondary | ICD-10-CM

## 2018-08-01 NOTE — Telephone Encounter (Signed)
Medication refill request: Estradiol 0.0375 mg patch twice weekly, pharmacy is requesting this as a 3 month supply not 1 month at a time Last AEX:  11/07/2017 Next AEX: 11/14/2018 Last MMG (if hormonal medication request): 06/22/2017 Birads 1 negative, next screening scheduled for 10/04/2018  Refill authorized: Estradiol 0.0375 mg #24 0RF  Please refill if appropriate.

## 2018-08-09 ENCOUNTER — Ambulatory Visit: Payer: Self-pay

## 2018-10-04 ENCOUNTER — Ambulatory Visit: Payer: Self-pay

## 2018-10-15 ENCOUNTER — Telehealth: Payer: Self-pay | Admitting: Certified Nurse Midwife

## 2018-10-15 NOTE — Telephone Encounter (Signed)
Patient will call back to reschedule aex that was cancelled due to provider schedule.

## 2018-10-17 ENCOUNTER — Other Ambulatory Visit: Payer: Self-pay | Admitting: Obstetrics & Gynecology

## 2018-10-17 DIAGNOSIS — N951 Menopausal and female climacteric states: Secondary | ICD-10-CM

## 2018-10-17 NOTE — Telephone Encounter (Signed)
Medication refill request: Vivelle dot  Last AEX:  11/07/17 Next AEX: nothing scheduled at this time  Last MMG (if hormonal medication request): 06/22/17 Bi-rads 1 neg  Refill authorized:  #24 with 0 RF

## 2018-11-14 ENCOUNTER — Ambulatory Visit: Payer: BLUE CROSS/BLUE SHIELD | Admitting: Certified Nurse Midwife

## 2018-12-18 ENCOUNTER — Ambulatory Visit: Payer: Self-pay

## 2019-01-07 ENCOUNTER — Other Ambulatory Visit: Payer: Self-pay | Admitting: Obstetrics & Gynecology

## 2019-01-07 DIAGNOSIS — N951 Menopausal and female climacteric states: Secondary | ICD-10-CM

## 2019-02-11 ENCOUNTER — Other Ambulatory Visit: Payer: Self-pay | Admitting: Certified Nurse Midwife

## 2019-02-11 ENCOUNTER — Other Ambulatory Visit: Payer: Self-pay | Admitting: Obstetrics & Gynecology

## 2019-02-11 DIAGNOSIS — N951 Menopausal and female climacteric states: Secondary | ICD-10-CM

## 2019-02-11 MED ORDER — ESTRADIOL 0.0375 MG/24HR TD PTTW: MEDICATED_PATCH | TRANSDERMAL | 0 refills | Status: DC

## 2019-02-11 NOTE — Telephone Encounter (Signed)
Patient is calling regarding refill of estradiol 0.0375MG /24 HR. Patient scheduled annual exam at time of call for 03/19/2019 at 9am. [facility] [address]

## 2019-02-11 NOTE — Telephone Encounter (Signed)
Medication refill request: Estradiol Last AEX:  11/07/2017 DL Next AEX: 03/19/2019 Last MMG (if hormonal medication request): 06/22/2017 BIRADS 1 Negative Density B Refill authorized: Pending #24 with no refills if appropriate. Please advise.

## 2019-02-17 ENCOUNTER — Telehealth: Payer: Self-pay | Admitting: Certified Nurse Midwife

## 2019-02-17 NOTE — Telephone Encounter (Signed)
Call to patient. Patient states that she and her husband both lost their jobs within 2 weeks of each other which resulted in loss of insurance. Patient recently took out a personal plan - [employer], but is unable to schedule preventative care or mammogram for the first 6 months of coverage. Patient states she is not currently having any problems. States she will run out of progesterone and estradiol prior to appointment. Patient asking if [facility] can fill? MyChart visit offered to patient due to not being able to schedule MMG for 6 months out. Patient agreeable. MyChart visit scheduled for Monday 02-24-2019 at 1600. Patient agreeable to date and time of appointment. Aware to log in 15 minutes prior to appointment to make sure no technical difficulties. AEX rescheduled to Monday 07-07-19 at 1600. Patient agreeable to date and time of appointment. [facility] to reschedule mammogram for the first week of March.  Routing to provider and will close encounter.

## 2019-02-17 NOTE — Telephone Encounter (Signed)
Message left to return call to Triage Nurse at [phone].    

## 2019-02-17 NOTE — Telephone Encounter (Signed)
Non-Urgent Medical Question Received: Yesterday Message Contents  Stankovic, Tashianna A sent to [place]  Phone Number: [phone]        I have a scheduled appt on Nov 11 with Jarvis Newcomer at 8:45. My new insurance does not cover preventative care appts for the first 6 months. I didnt realize this at the time I made this appt. The insurance will cover if I wait until the end of February. However, my medications will need to be refilled soon. Is there a cheaper way to do this by appointment to keep the cost down? Like a RX appt? Then I can reschedule my check up in February.     I left patient a message to call the office to reschedule her aex from November to February 2021.

## 2019-02-17 NOTE — Telephone Encounter (Signed)
Patient is returning a call to triage. 

## 2019-02-24 ENCOUNTER — Telehealth (INDEPENDENT_AMBULATORY_CARE_PROVIDER_SITE_OTHER): Payer: No Typology Code available for payment source | Admitting: Certified Nurse Midwife

## 2019-02-24 ENCOUNTER — Encounter: Payer: Self-pay | Admitting: Certified Nurse Midwife

## 2019-02-24 ENCOUNTER — Other Ambulatory Visit: Payer: Self-pay

## 2019-02-24 DIAGNOSIS — N951 Menopausal and female climacteric states: Secondary | ICD-10-CM | POA: Diagnosis not present

## 2019-02-24 MED ORDER — PROGESTERONE MICRONIZED 200 MG PO CAPS: 200.0000 mg | ORAL_CAPSULE | Freq: Every day | ORAL | 5 refills | Status: DC

## 2019-02-24 NOTE — Patient Instructions (Signed)
Menopause and Hormone Replacement Therapy Menopause is a normal time of life when menstrual periods stop completely and the ovaries stop producing the female hormones estrogen and progesterone. This lack of hormones can affect your health and cause undesirable symptoms. Hormone replacement therapy (HRT) can relieve some of those symptoms. What is hormone replacement therapy? HRT is the use of artificial (synthetic) hormones to replace hormones that your body has stopped producing because you have reached menopause. What are my options for HRT?  HRT [place] consist of the synthetic hormones estrogen and progestin, or it [place] consist of only estrogen (estrogen-only therapy). You and your health care provider will decide which form of HRT is best for you. If you choose to be on HRT and you have a uterus, estrogen and progestin are usually prescribed. Estrogen-only therapy is used for women who do not have a uterus. Possible options for taking HRT include:  Pills.  Patches.  Gels.  Sprays.  Vaginal cream.  Vaginal rings.  Vaginal inserts. The amount of hormone(s) that you take and how long you take the hormone(s) varies according to your health. It is important to:  Begin HRT with the lowest possible dosage.  Stop HRT as soon as your health care provider tells you to stop.  Work with your health care provider so that you feel informed and comfortable with your decisions. What are the benefits of HRT? HRT can reduce the frequency and severity of menopausal symptoms. Benefits of HRT vary according to the kind of symptoms that you have, how severe they are, and your overall health. HRT [place] help to improve the following symptoms of menopause:  Hot flashes and night sweats. These are sudden feelings of heat that spread over the face and body. The skin [place] turn red, like a blush. Night sweats are hot flashes that happen while you are sleeping or trying to sleep.  Bone loss (osteoporosis). The  body loses calcium more quickly after menopause, causing the bones to become weaker. This can increase the risk for bone breaks (fractures).  Vaginal dryness. The lining of the vagina can become thin and dry, which can cause pain during sex or cause infection, burning, or itching.  Urinary tract infections.  Urinary incontinence. This is the inability to control when you pass urine.  Irritability.  Short-term memory problems. What are the risks of HRT? Risks of HRT vary depending on your individual health and medical history. Risks of HRT also depend on whether you receive both estrogen and progestin or you receive estrogen only. HRT [place] increase the risk of:  Spotting. This is when a small amount of blood leaks from the vagina unexpectedly.  Endometrial cancer. This cancer is in the lining of the uterus (endometrium).  Breast cancer.  Increased density of breast tissue. This can make it harder to find breast cancer on a breast X-ray (mammogram).  Stroke.  Heart disease.  Blood clots.  Gallbladder disease.  Liver disease. Risks of HRT can increase if you have any of the following conditions:  Endometrial cancer.  Liver disease.  Heart disease.  Breast cancer.  History of blood clots.  History of stroke. Follow these instructions at home:  Take over-the-counter and prescription medicines only as told by your health care provider.  Get mammograms, pelvic exams, and medical checkups as often as told by your health care provider.  Have Pap tests done as often as told by your health care provider. A Pap test is sometimes called a Pap smear. It   is a screening test that is used to check for signs of cancer of the cervix and vagina. A Pap test can also identify the presence of infection or precancerous changes. Pap tests [place] be done: ? Every 3 years, starting at age 21. ? Every 5 years, starting after age 30, in combination with testing for human papillomavirus (HPV). ?  More often or less often depending on other medical conditions you have, your age, and other risk factors.  It is up to you to get the results of your Pap test. Ask your health care provider, or the department that is doing the test, when your results will be ready.  Keep all follow-up visits as told by your health care provider. This is important. Contact a health care provider if you have:  Pain or swelling in your legs.  Shortness of breath.  Chest pain.  Lumps or changes in your breasts or armpits.  Slurred speech.  Pain, burning, or bleeding when you urinate.  Unusual vaginal bleeding.  Dizziness or headaches.  Weakness or numbness in any part of your arms or legs.  Pain in your abdomen. Summary  Menopause is a normal time of life when menstrual periods stop completely and the ovaries stop producing the female hormones estrogen and progesterone.  Hormone replacement therapy (HRT) can relieve some of the symptoms of menopause.  HRT can reduce the frequency and severity of menopausal symptoms.  Risks of HRT vary depending on your individual health and medical history. This information is not intended to replace advice given to you by your health care provider. Make sure you discuss any questions you have with your health care provider. Document Released: 01/21/2003 Document Revised: 12/25/2017 Document Reviewed: 12/25/2017 Elsevier Patient Education  2020 Elsevier Inc.  

## 2019-02-24 NOTE — Progress Notes (Signed)
Review of Systems  Constitutional: Negative.  Negative for weight loss.  HENT: Negative.   Eyes: Negative.        Dry eyes using Systane with good response  Respiratory: Negative.   Cardiovascular: Negative.   Gastrointestinal: Negative.   Genitourinary: Negative.        Denies vaginal bleeding or vaginal dryness, uses coconut oil if needed for dryness  Musculoskeletal: Negative.   Skin: Negative.        Hot flashes have decreased and so have night sweats  Neurological: Negative.  Negative for headaches.  Endo/Heme/Allergies: Negative.   Psychiatric/Behavioral: The patient is not nervous/anxious and does not have insomnia.    Time of onset virtual visit 4:40 pm Completed visit at 5:05 pm   53 y.o. Married Caucasian G4P4 on Virtual call for evaluation of HRT. She has been using  Vivelle-Dot 0.0375 mg patch twice weekly and Prometrium 200 mg orally  every other day for symptomatic relief of Menopausal symptoms. She stopped daily use of Prometrium because she did not feel as good with daily and the every other day is working well. She desires continuance, but is unable to come in for aex due to insurance restraints until 07/07/2019. She is doing her regular monthly breast exams with no changes noted or nipple discharge. She has next mammogram appointment on 07/07/2019 also. 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 She has noted no warning signs with use of the HRT. Denies vaginal bleeding, headache, chest or leg pain or other DVT signs with use. She is using coconut oil as needed for vaginal dryness. Eating healthy and drinking water daily. Emotionally coping well with the employment changes. Denies any concerns today. She would like to continue HRT as she is using now. No other health concerns today.   O: Healthy female, WD WN Affect: normal orientation X 3  Appearance healthy and smiling.   A: Menopausal on HRT working well. No  contraindications to use for menopausal symptom relieve. Mammogram and Annual exam scheduled for 07/07/2019.  P: Discussed risks/benefits/warning signs with continued use of HRT and needs to advise if occurs and stop medication. Patient voiced understanding. Stressed SBE and if any change needs to come in for visit for evaluation, patient agreeable. Continue with good eating habits and exercise. Rx Vivelle-Dot 0.0375mg  Rx Prometrium 200 mg every other day. See order with instructions.  Rv 3/1/2021or prn if change

## 2019-03-03 ENCOUNTER — Ambulatory Visit: Payer: Self-pay

## 2019-03-04 ENCOUNTER — Telehealth: Payer: Self-pay | Admitting: Certified Nurse Midwife

## 2019-03-04 NOTE — Telephone Encounter (Signed)
Call to patient. Last seen for virtual visit on 02-24-2019. Patient states that she does self breast exams daily. Has not noted any change in size of breast cyst, but states the area is really tender and the pain is radiating to her under arm area. Patient states she thought it was a pulled muscle because she has been lifting and moving a lot lately, but the pain has not improved. Patient advised OV needed for further evaluation prior to scheduling diagnostic MMG. Last MMG 06-22-17 was normal. Patient states her insurance does not kick in until March of 2021. Patient asking if Debbi really needs to see her? States she called [facility] this morning and was told she did not need to be seen prior to diagnostic MMG. Patient scheduled for breast check on 03-07-2019 at 1530, patient agreeable to date and time of appointment. Advised would review with Debbi and return call. Could always cancel appointment if not needed. Patient agreeable.   Routing to provider for review.

## 2019-03-04 NOTE — Telephone Encounter (Signed)
Patient has been having pain in her right breast for the past week. States she has a benign cyst in right breast that she's been keeping an eye on. Noticed underneath her right arm is sore where lymph nodes are and it is radiating into her right breast. Would like order for diagnostic mammogram. States if you cannot reach her, OK to leave voicemail. Would like mammogram appointment after 2pm on Friday 03/07/19.

## 2019-03-04 NOTE — Telephone Encounter (Signed)
Call to patient. Message given to patient as seen below from Debbi and she verbalized understanding. Will keep appointment as scheduled for 03-07-2019 at 1530.   Routing to provider and will close encounter.

## 2019-03-04 NOTE — Telephone Encounter (Signed)
They will require OV due to order needs to be specific is far as I am aware

## 2019-03-05 ENCOUNTER — Other Ambulatory Visit: Payer: Self-pay

## 2019-03-07 ENCOUNTER — Ambulatory Visit (INDEPENDENT_AMBULATORY_CARE_PROVIDER_SITE_OTHER): Payer: No Typology Code available for payment source | Admitting: Certified Nurse Midwife

## 2019-03-07 ENCOUNTER — Encounter: Payer: Self-pay | Admitting: Certified Nurse Midwife

## 2019-03-07 ENCOUNTER — Other Ambulatory Visit: Payer: Self-pay

## 2019-03-07 VITALS — BP 118/80 | HR 70 | Temp 97.2°F | Resp 16 | Wt 175.0 lb

## 2019-03-07 DIAGNOSIS — R599 Enlarged lymph nodes, unspecified: Secondary | ICD-10-CM

## 2019-03-07 DIAGNOSIS — N6310 Unspecified lump in the right breast, unspecified quadrant: Secondary | ICD-10-CM | POA: Diagnosis not present

## 2019-03-07 NOTE — Progress Notes (Addendum)
   Subjective:   53 y.o. MarriedCaucasian female presents for evaluation of right breast mass. Onset of the symptoms was over a week ago. Patient sought evaluation because of breast tenderness.  Contributing factors include: She does lift granddaughter and wonders if muscular issues. Denies nipple discharge or redness in breast. Has a cyst in the right breast and noted tenderness in axillary. Denies chills, fatigue and fevers. Patient denies history of trauma, bites, or injuries. Last mammogram was 06/2017.  Previous evaluation has included SBE and here today.   Review of Systems Pertinent items are noted in HPI.   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 Additional fibroadenoma beside this area,slight tenderness, no nipple discharge or redness. Axillary area at upper arm pit at 12 o'clock enlarged lymph node noted, slightly tender, no redness or exudate., No ingrown hair noted.  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 Possible additional fibroadenoma palpated. . Left breast no masses, tenderness or redness, no nipple discharge, axillary area no enlarged lymph nodes..   Plan:   PLAN: Discussed findings with patient and feel need for diagnostic mammogram with [place]. Patient agreeable. Will advise if status changes with tenderness or redness occurring. She will be called with appointment information. Questions addressed.  Rv prn

## 2019-03-10 ENCOUNTER — Telehealth: Payer: Self-pay | Admitting: *Deleted

## 2019-03-10 DIAGNOSIS — R599 Enlarged lymph nodes, unspecified: Secondary | ICD-10-CM

## 2019-03-10 DIAGNOSIS — N6310 Unspecified lump in the right breast, unspecified quadrant: Secondary | ICD-10-CM

## 2019-03-10 NOTE — Telephone Encounter (Signed)
-----   Message from Regina Eck, CNM sent at 03/07/2019  4:23 PM EDT ----- Patient needs to be scheduled for diagnostic mammogram  and [place] on right breast . Enlarged lymph node in axillary area on right also. She is aware she will be called with information regarding appointment

## 2019-03-10 NOTE — Telephone Encounter (Signed)
Order placed for bilateral Dx MMG and right breast US, if needed.   Spoke with Benjamine Mola at [facility], scheduled for 03/19/19 at 2:40pm, arrive at 2:20pm.    Patient notified of appt and is agreeable to date and time.   Routing to provider for final review. Patient is agreeable to disposition. Will close encounter.

## 2019-03-19 ENCOUNTER — Ambulatory Visit: Payer: BLUE CROSS/BLUE SHIELD | Admitting: Certified Nurse Midwife

## 2019-03-19 ENCOUNTER — Other Ambulatory Visit: Payer: Self-pay

## 2019-03-19 ENCOUNTER — Ambulatory Visit: Payer: Self-pay

## 2019-03-19 ENCOUNTER — Ambulatory Visit
Admission: RE | Admit: 2019-03-19 | Discharge: 2019-03-19 | Disposition: A | Discharge status: Home / Self Care | Payer: No Typology Code available for payment source | Source: Ambulatory Visit | Attending: Certified Nurse Midwife | Admitting: Certified Nurse Midwife

## 2019-03-19 ENCOUNTER — Other Ambulatory Visit: Payer: Self-pay | Admitting: Certified Nurse Midwife

## 2019-03-19 DIAGNOSIS — N6310 Unspecified lump in the right breast, unspecified quadrant: Secondary | ICD-10-CM

## 2019-03-19 DIAGNOSIS — R599 Enlarged lymph nodes, unspecified: Secondary | ICD-10-CM

## 2019-03-20 ENCOUNTER — Telehealth: Payer: Self-pay | Admitting: *Deleted

## 2019-03-20 NOTE — Telephone Encounter (Signed)
Agree with removal from MMG hold.  Pt aware of follow up recommendation if pain persists.  Ok to close encounter.

## 2019-03-20 NOTE — Telephone Encounter (Signed)
Notes recorded by Burnice Logan, RN on 03/20/2019 at 1:35 PM EST  Left message to call Sharee Pimple, RN at [place].

## 2019-03-20 NOTE — Telephone Encounter (Signed)
Spoke with patient, advised per Melvia Heaps, CNM. Patient states lump in right axilla in still "a little tender and lump still present", but improving. Patient states she discussed this with the radiologist, she had been doing heavy lifting just prior to noticing the lump and tenderness.  Patient declines OV at this time, states if lump or tenderness does not completely resolve in the next week she will return call to schedule OV. Patient verbalizes understanding.    Removed from MMG hold.  Bilateral screening recommended in 1 yr.    Routing to Dr. Sabra Heck   Cc: Melvia Heaps, CNM

## 2019-03-20 NOTE — Telephone Encounter (Signed)
Patient returned call

## 2019-03-20 NOTE — Telephone Encounter (Signed)
-----   Message from Regina Eck, CNM sent at 03/20/2019 12:11 PM EST ----- Notify patient I have reviewed mammogram and agree with finding that fibroadenoma felt same.  Axillary is normal. If she feels this is still present needs OV.  Mammogram recall one year

## 2019-03-21 NOTE — Telephone Encounter (Signed)
Encounter closed

## 2019-03-31 ENCOUNTER — Other Ambulatory Visit: Payer: Self-pay

## 2019-04-01 NOTE — Progress Notes (Signed)
   Subjective:   53 y.o. Married Caucasian female presents for follow up  of right breast tenderness and  Mass.Patient was seen for diagnostic mammogram and [place] for fullness and tenderness in axillary area. Known fibroadenoma in breast and no change at time seen. Patient feels soreness that she was having was muscle, because axillary area not tender now. Denies any redness, tenderness or nipple discharge from either breasts. She notes when she stretches neck it felt better. Relieved no change in fibroadenoma.trauma, bites, or injuries. 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 No other health issues.  Review of Systems Pertinent items noted in HPI and remainder of comprehensive ROS otherwise negative.@SUBJECTIVE    Objective:   General appearance: alert, cooperative and appears stated age  Breasts: normal appearance, no masses or tenderness, No nipple retraction or dimpling, No nipple discharge or bleeding, No axillary or supraclavicular adenopathy on left. Right breast fibroadenoma still present at 10-12 o'clock area, no change. No fullness under axilla or on right side now. Slight tenderness with movement only, no nipple discharge or skin change. Patient agrees with finding.   Assessment:   ASSESSMENT:Patient is diagnosed with history of right breast fibroadenoma no change per imaging with diagnostic mammogram and [place]. No axillary mass. Tenderness resolving, suspect muscular. Normal breast exam on left.   Plan:   PLAN: Reviewed finding of breast exam and agree with mammogram finding. Discussed soreness and use of moist heat and Advil per OTC instructions with food. Advise if this resolves the soreness or other concerns. "relieved there is no change in fibroadenoma". SBE monthly ,  Mammogram yearly  Rv prn

## 2019-04-02 ENCOUNTER — Other Ambulatory Visit: Payer: Self-pay

## 2019-04-02 ENCOUNTER — Encounter: Payer: Self-pay | Admitting: Certified Nurse Midwife

## 2019-04-02 ENCOUNTER — Ambulatory Visit: Payer: No Typology Code available for payment source | Admitting: Certified Nurse Midwife

## 2019-04-02 VITALS — BP 114/68 | HR 72 | Temp 97.2°F | Resp 16 | Wt 174.0 lb

## 2019-04-02 DIAGNOSIS — M791 Myalgia, unspecified site: Secondary | ICD-10-CM

## 2019-04-02 DIAGNOSIS — D241 Benign neoplasm of right breast: Secondary | ICD-10-CM | POA: Diagnosis not present

## 2019-04-02 NOTE — Patient Instructions (Signed)
Breast Self-Awareness Breast self-awareness means being familiar with how your breasts look and feel. It involves checking your breasts regularly and reporting any changes to your health care provider. Practicing breast self-awareness is important. Sometimes changes Manalo not be harmful (are benign), but sometimes a change in your breasts can be a sign of a serious medical problem. It is important to learn how to do this procedure correctly so that you can catch problems early, when treatment is more likely to be successful. All women should practice breast self-awareness, including women who have had breast implants. What you need:  A mirror.  A well-lit room. How to do a breast self-exam A breast self-exam is one way to learn what is normal for your breasts and whether your breasts are changing. To do a breast self-exam: Look for changes  1. Remove all the clothing above your waist. 2. Stand in front of a mirror in a room with good lighting. 3. Put your hands on your hips. 4. Push your hands firmly downward. 5. Compare your breasts in the mirror. Look for differences between them (asymmetry), such as: ? Differences in shape. ? Differences in size. ? Puckers, dips, and bumps in one breast and not the other. 6. Look at each breast for changes in the skin, such as: ? Redness. ? Scaly areas. 7. Look for changes in your nipples, such as: ? Discharge. ? Bleeding. ? Dimpling. ? Redness. ? A change in position. Feel for changes Carefully feel your breasts for lumps and changes. It is best to do this while lying on your back on the floor, and again while sitting or standing in the tub or shower with soapy water on your skin. Feel each breast in the following way: 1. Place the arm on the side of the breast you are examining above your head. 2. Feel your breast with the other hand. 3. Start in the nipple area and make -inch (2 cm) overlapping circles to feel your breast. Use the pads of your  three middle fingers to do this. Apply light pressure, then medium pressure, then firm pressure. The light pressure will allow you to feel the tissue closest to the skin. The medium pressure will allow you to feel the tissue that is a little deeper. The firm pressure will allow you to feel the tissue close to the ribs. 4. Continue the overlapping circles, moving downward over the breast until you feel your ribs below your breast. 5. Move one finger-width toward the center of the body. Continue to use the -inch (2 cm) overlapping circles to feel your breast as you move slowly up toward your collarbone. 6. Continue the up-and-down exam using all three pressures until you reach your armpit.  Write down what you find Writing down what you find can help you remember what to discuss with your health care provider. Write down:  What is normal for each breast.  Any changes that you find in each breast, including: ? The kind of changes you find. ? Any pain or tenderness. ? Size and location of any lumps.  Where you are in your menstrual cycle, if you are still menstruating. General tips and recommendations  Examine your breasts every month.  If you are breastfeeding, the best time to examine your breasts is after a feeding or after using a breast pump.  If you menstruate, the best time to examine your breasts is 5-7 days after your period. Breasts are generally lumpier during menstrual periods, and it Baldi  be more difficult to notice changes.  With time and practice, you will become more familiar with the variations in your breasts and more comfortable with the exam. Contact a health care provider if you:  See a change in the shape or size of your breasts or nipples.  See a change in the skin of your breast or nipples, such as a reddened or scaly area.  Have unusual discharge from your nipples.  Find a lump or thick area that was not there before.  Have pain in your breasts.  Have any  concerns related to your breast health. Summary  Breast self-awareness includes looking for physical changes in your breasts, as well as feeling for any changes within your breasts.  Breast self-awareness should be performed in front of a mirror in a well-lit room.  You should examine your breasts every month. If you menstruate, the best time to examine your breasts is 5-7 days after your menstrual period.  Let your health care provider know of any changes you notice in your breasts, including changes in size, changes on the skin, pain or tenderness, or unusual fluid from your nipples. This information is not intended to replace advice given to you by your health care provider. Make sure you discuss any questions you have with your health care provider. Document Released: 04/24/2005 Document Revised: 12/11/2017 Document Reviewed: 12/11/2017 Elsevier Patient Education  [place] A fibroadenoma is a lump (tumor) in the breast. The lump is benign. This means that it is not cancer. It Supinski move under your skin when you touch it. This kind of lump can grow in one breast or in both breasts. The cause of this condition is not known. Some of the lumps are too small to be felt. Others can be felt as firm, round, smooth, or movable lumps. This kind of lump can be treated with regular breast exams. Exams are done to check for changes in the tumor. In some cases, the tumor is removed. The tumor can be removed if:  It is large.  It continues to grow.  It is causing pain or changes in the skin of the breast.  The patient is an adolescent girl. Lumps in young girls tend to grow over time. Follow these instructions at home: Breast exams   Check your breasts at home as told by your doctor. Report any changes or concerns. Check for the following: ? The size of the tumor. ? The look and feel of the skin of your breasts. ? The look and feel of your nipples. General  instructions  If you had a lump removed, follow instructions from your doctor for home care after surgery.  Do not use any products that contain nicotine or tobacco, such as cigarettes and e-cigarettes. These can further increase your cancer risk. If you need help quitting, ask your doctor.  Keep all follow-up visits as told by your doctor. This is important. You will need breast exams on a regular basis. Contact a doctor if:  The lump changes in size or feels different.  The lump starts to be painful.  You find a new lump.  You have any changes in the skin that covers your breast.  You have any changes in your nipple.  You have fluid leaking from your nipple.  You have redness around your nipple. Summary  A fibroadenoma is a lump (tumor) in the breast. The lump is benign. This means that it is not cancer.  This tumor [place]  feel like a firm, round, smooth, and movable lump in your breast. Some fibroadenomas are too small to be felt.  Having this condition [place] slightly increase your risk for developing breast cancer in the future.  Do breast exams at home. Watch for changes in the size of the lump. Also, watch for changes in the look and feel of your nipples and the skin of your breast.  Contact your doctor if the lump grows bigger or starts to cause pain. Also, let your doctor know if there is a change in the way your nipples look and in the feel of the skin of your breast. This information is not intended to replace advice given to you by your health care provider. Make sure you discuss any questions you have with your health care provider. Document Released: 07/21/2008 Document Revised: 05/07/2017 Document Reviewed: 05/07/2017 Elsevier Patient Education  2020 [employer].

## 2019-04-22 ENCOUNTER — Other Ambulatory Visit: Payer: Self-pay | Admitting: Certified Nurse Midwife

## 2019-04-22 DIAGNOSIS — N951 Menopausal and female climacteric states: Secondary | ICD-10-CM

## 2019-04-22 NOTE — Telephone Encounter (Signed)
Med refill request: Vanessa Hopkins Last AEX: 11/07/2017 Next AEX: Cancelled 07/07/2019 AEX,  Last MMG (if hormonal med) 03/19/19, BIRADS 2, Benign.  Refill authorized: Please Advise? #8 patches, 2 RF, orders pended if approved.

## 2019-04-22 NOTE — Telephone Encounter (Signed)
Left message for pt to call back to schedule AEX and get med refill .

## 2019-04-22 NOTE — Telephone Encounter (Signed)
Pt states did not cancel AEX in march just her MMG appt bc same day. Has not currently called to reschedule MMG but plans to. Pt now scheduled back for AEX on 07/07/2019 at 4pm with DL. Pt still request Vivelle dot refill request.   Will route to D. Hollice Espy, CNM for refill request and review.

## 2019-04-22 NOTE — Telephone Encounter (Signed)
Needs to aex as scheduled to continue HRT. Will renew until then only. She was seen for breast exam only.

## 2019-07-02 NOTE — Progress Notes (Signed)
54 y.o. G26P4 Married  Caucasian Fe here for annual exam. Menopausal on HRT for symptomatic relief of   hot flashes and night sweats.Patient has not been taking Prometrium daily as prescribed, she does not rest well when she takes daily. Has been taking every other day. Uses estrogen patch twice weekly. Denies any vaginal bleeding or vaginal dryness. Had mammogram and feels relieved no changes with fibroadenoma in right breast. Doing SBE monthly. Denies any bladder or bowel changes. No other health issues today.     No LMP recorded. Patient has had an ablation.          Sexually active: Yes.    The current method of family planning is ablation.    Exercising: No.  The patient does not participate in regular exercise at present. Smoker:  no  Review of Systems  Constitutional: Negative.   HENT: Negative.   Eyes: Negative.   Respiratory: Negative.   Cardiovascular: Negative.   Gastrointestinal: Negative.   Genitourinary: Negative.   Musculoskeletal: Negative.   Skin: Negative.   Neurological: Negative.   Endo/Heme/Allergies: Negative.   Psychiatric/Behavioral: The patient has insomnia.     Health Maintenance: Pap:  10-11-16 neg History of Abnormal Pap: no MMG:  03/2019 bilateral & rt axilla u/s category b density birads 2:neg Self Breast exams: yes Colonoscopy:  none BMD:   none TDaP:  6/18 Shingles: no Pneumonia: no Hep C and HIV: not done Labs: discuss with provider   reports that she has never smoked. She has never used smokeless tobacco. She reports current alcohol use of about 2.0 - 4.0 standard drinks of alcohol per week. She reports that she does not use drugs.  Past Medical History:  Diagnosis Date  . Anxiety   . Fibroadenoma of breast 2000    Past Surgical History:  Procedure Laterality Date  . BREAST BIOPSY Right 2012?  Marland Kitchen BREAST BIOPSY Right    pin in place  . BREAST EXCISIONAL BIOPSY Right    benign  . BREAST FIBROADENOMA SURGERY Right 2000  . CRYOABLATION   2009  . DIAGNOSTIC LAPAROSCOPY  2012?  . PELVIC LAPAROSCOPY    . WISDOM TOOTH EXTRACTION      Current Outpatient Medications  Medication Sig Dispense Refill  . Ascorbic Acid (VITAMIN C PO) Take by mouth.    . B Complex Vitamins (VITAMIN B COMPLEX PO) Take 1 tablet by mouth daily.    Marland Kitchen BIOTIN PO Take by mouth.    . Cholecalciferol (VITAMIN D PO) Take by mouth.    . CRANBERRY PO Take by mouth as needed.    Marland Kitchen estradiol (VIVELLE-DOT) 0.0375 MG/24HR APPLY 1 PATCH TWICE WEEKLY TO ABDOMEN 8 patch 2  . LYSINE PO Take by mouth.    . Multiple Vitamins-Minerals (ZINC PO) Take by mouth.    . Omega-3 Fatty Acids (FISH OIL PO) Take by mouth.    . Probiotic Product (PROBIOTIC PO) Take by mouth.    . progesterone (PROMETRIUM) 200 MG capsule Take 1 capsule (200 mg total) by mouth daily. (Patient taking differently: Take 200 mg by mouth every other day. ) 30 capsule 5   No current facility-administered medications for this visit.    Family History  Problem Relation Age of Onset  . Heart murmur Mother   . Kidney cancer Father   . Diabetes Sister   . Migraines Sister   . Cervical cancer Maternal Grandmother   . Heart attack Maternal Grandfather     ROS:  Pertinent items are  noted in HPI.  Otherwise, a comprehensive ROS was negative.  Exam:   BP 124/72 (BP Location: Right Arm, Patient Position: Sitting, Cuff Size: Normal)   Pulse 68   Temp 98.4 F (36.9 C) (Skin)   Resp 14   Ht 5' 6.75" (1.695 m)   Wt 175 lb 4 oz (79.5 kg)   BMI 27.65 kg/m  Height: 5' 6.75" (169.5 cm) Ht Readings from Last 3 Encounters:  07/07/19 5' 6.75" (1.695 m)  11/07/17 5' 6.75" (1.695 m)  02/02/17 5\' 7"  (1.702 m)    General appearance: alert, cooperative and appears stated age Head: Normocephalic, without obvious abnormality, atraumatic Neck: no adenopathy, supple, symmetrical, trachea midline and thyroid normal to inspection and palpation Lungs: clear to auscultation bilaterally Breasts: normal appearance, no  masses or tenderness, No nipple retraction or dimpling, No nipple discharge or bleeding, No axillary or supraclavicular adenopathy, right breast known fibroadenoma palpated at 10-12 o'clock. Heart: regular rate and rhythm Abdomen: soft, non-tender; no masses,  no organomegaly Extremities: extremities normal, atraumatic, no cyanosis or edema Skin: Skin color, texture, turgor normal. No rashes or lesions Lymph nodes: Cervical, supraclavicular, and axillary nodes normal. No abnormal inguinal nodes palpated Neurologic: Grossly normal   Pelvic: External genitalia:  no lesions, normal female              Urethra:  normal appearing urethra with no masses, tenderness or lesions              Bartholin's and Skene's: normal                 Vagina: normal appearing vagina with normal color and discharge, no lesions              Cervix: no cervical motion tenderness, no lesions and normal appearance              Pap taken: Yes.   Bimanual Exam:  Uterus:  normal size, contour, position, consistency, mobility, non-tender and mid position              Adnexa: normal adnexa and no mass, fullness, tenderness               Rectovaginal: Confirms               Anus:  normal sphincter tone, no lesions  Chaperone present: yes  A:  Well Woman with normal exam  Menopausal on HRT working well desires continuance  Right breast known fibroadenoma,no change with recent mammogram  PCP exam due  P:   Reviewed health and wellness pertinent to exam  Risks/benefits/warning signs given for HRT use. Discussed she needs to take Prometrium daily. Will change Rx and see if better tolerated, patient to advise. Discussed importance of hyperplasia prevention with Prometrium use, so must use daily. Patient agreeable.  Rx Prometrium 100 mg daily see order with instructions  Rx Vivelle dot 0.0375mg  see order with instructions  Stressed monthly breast exam and if changes to come in . Have yearly mammogram for follow  up.  Schedule PCP visit.  Pap smear: yes   counseled on breast self exam, mammography screening, feminine hygiene, menopause, adequate intake of calcium and vitamin D, diet and exercise  return annually or prn  An After Visit Summary was printed and given to the patient.

## 2019-07-03 ENCOUNTER — Other Ambulatory Visit: Payer: Self-pay

## 2019-07-07 ENCOUNTER — Ambulatory Visit: Payer: Self-pay

## 2019-07-07 ENCOUNTER — Ambulatory Visit: Payer: No Typology Code available for payment source | Admitting: Certified Nurse Midwife

## 2019-07-07 ENCOUNTER — Other Ambulatory Visit (HOSPITAL_COMMUNITY)
Admission: RE | Admit: 2019-07-07 | Discharge: 2019-07-07 | Disposition: A | Discharge status: Home / Self Care | Payer: No Typology Code available for payment source | Source: Ambulatory Visit | Attending: Obstetrics & Gynecology | Admitting: Obstetrics & Gynecology

## 2019-07-07 ENCOUNTER — Other Ambulatory Visit: Payer: Self-pay

## 2019-07-07 ENCOUNTER — Encounter: Payer: Self-pay | Admitting: Certified Nurse Midwife

## 2019-07-07 ENCOUNTER — Ambulatory Visit: Payer: BLUE CROSS/BLUE SHIELD | Admitting: Certified Nurse Midwife

## 2019-07-07 VITALS — BP 124/72 | HR 68 | Temp 98.4°F | Resp 14 | Ht 66.75 in | Wt 175.2 lb

## 2019-07-07 DIAGNOSIS — Z124 Encounter for screening for malignant neoplasm of cervix: Secondary | ICD-10-CM | POA: Insufficient documentation

## 2019-07-07 DIAGNOSIS — N951 Menopausal and female climacteric states: Secondary | ICD-10-CM | POA: Diagnosis not present

## 2019-07-07 DIAGNOSIS — D241 Benign neoplasm of right breast: Secondary | ICD-10-CM

## 2019-07-07 DIAGNOSIS — Z01419 Encounter for gynecological examination (general) (routine) without abnormal findings: Secondary | ICD-10-CM

## 2019-07-07 DIAGNOSIS — Z79890 Hormone replacement therapy (postmenopausal): Secondary | ICD-10-CM | POA: Diagnosis not present

## 2019-07-07 MED ORDER — PROGESTERONE MICRONIZED 100 MG PO CAPS: 100.0000 mg | ORAL_CAPSULE | Freq: Every day | ORAL | 6 refills | Status: DC

## 2019-07-07 MED ORDER — ESTRADIOL 0.0375 MG/24HR TD PTTW: MEDICATED_PATCH | TRANSDERMAL | 3 refills | Status: DC

## 2019-07-07 NOTE — Patient Instructions (Signed)
EXERCISE AND DIET:  We recommended that you start or continue a regular exercise program for good health. Regular exercise means any activity that makes your heart beat faster and makes you sweat.  We recommend exercising at least 30 minutes per day at least 3 days a week, preferably 4 or 5.  We also recommend a diet low in fat and sugar.  Inactivity, poor dietary choices and obesity can cause diabetes, heart attack, stroke, and kidney damage, among others.    ALCOHOL AND SMOKING:  Women should limit their alcohol intake to no more than 7 drinks/beers/glasses of wine (combined, not each!) per week. Moderation of alcohol intake to this level decreases your risk of breast cancer and liver damage. And of course, no recreational drugs are part of a healthy lifestyle.  And absolutely no smoking or even second hand smoke. Most people know smoking can cause heart and lung diseases, but did you know it also contributes to weakening of your bones? Aging of your skin?  Yellowing of your teeth and nails?  CALCIUM AND VITAMIN D:  Adequate intake of calcium and Vitamin D are recommended.  The recommendations for exact amounts of these supplements seem to change often, but generally speaking 600 mg of calcium (either carbonate or citrate) and 800 units of Vitamin D per day seems prudent. Certain women Marion benefit from higher intake of Vitamin D.  If you are among these women, your doctor will have told you during your visit.    PAP SMEARS:  Pap smears, to check for cervical cancer or precancers,  have traditionally been done yearly, although recent scientific advances have shown that most women can have pap smears less often.  However, every woman still should have a physical exam from her gynecologist every year. It will include a breast check, inspection of the vulva and vagina to check for abnormal growths or skin changes, a visual exam of the cervix, and then an exam to evaluate the size and shape of the uterus and  ovaries.  And after 54 years of age, a rectal exam is indicated to check for rectal cancers. We will also provide age appropriate advice regarding health maintenance, like when you should have certain vaccines, screening for sexually transmitted diseases, bone density testing, colonoscopy, mammograms, etc.   MAMMOGRAMS:  All women over 40 years old should have a yearly mammogram. Many facilities now offer a "3D" mammogram, which Howell cost around $50 extra out of pocket. If possible,  we recommend you accept the option to have the 3D mammogram performed.  It both reduces the number of women who will be called back for extra views which then turn out to be normal, and it is better than the routine mammogram at detecting truly abnormal areas.    COLONOSCOPY:  Colonoscopy to screen for colon cancer is recommended for all women at age 50.  We know, you hate the idea of the prep.  We agree, BUT, having colon cancer and not knowing it is worse!!  Colon cancer so often starts as a polyp that can be seen and removed at colonscopy, which can quite literally save your life!  And if your first colonoscopy is normal and you have no family history of colon cancer, most women don't have to have it again for 10 years.  Once every ten years, you can do something that Stegenga end up saving your life, right?  We will be happy to help you get it scheduled when you are ready.    Be sure to check your insurance coverage so you understand how much it will cost.  It Kolar be covered as a preventative service at no cost, but you should check your particular policy.      Menopause and Hormone Replacement Therapy Menopause is a normal time of life when menstrual periods stop completely and the ovaries stop producing the female hormones estrogen and progesterone. This lack of hormones can affect your health and cause undesirable symptoms. Hormone replacement therapy (HRT) can relieve some of those symptoms. What is hormone replacement  therapy? HRT is the use of artificial (synthetic) hormones to replace hormones that your body has stopped producing because you have reached menopause. What are my options for HRT?  HRT Nazar consist of the synthetic hormones estrogen and progestin, or it Bucher consist of only estrogen (estrogen-only therapy). You and your health care provider will decide which form of HRT is best for you. If you choose to be on HRT and you have a uterus, estrogen and progestin are usually prescribed. Estrogen-only therapy is used for women who do not have a uterus. Possible options for taking HRT include:  Pills.  Patches.  Gels.  Sprays.  Vaginal cream.  Vaginal rings.  Vaginal inserts. The amount of hormone(s) that you take and how long you take the hormone(s) varies according to your health. It is important to:  Begin HRT with the lowest possible dosage.  Stop HRT as soon as your health care provider tells you to stop.  Work with your health care provider so that you feel informed and comfortable with your decisions. What are the benefits of HRT? HRT can reduce the frequency and severity of menopausal symptoms. Benefits of HRT vary according to the kind of symptoms that you have, how severe they are, and your overall health. HRT Alioto help to improve the following symptoms of menopause:  Hot flashes and night sweats. These are sudden feelings of heat that spread over the face and body. The skin K[place] turn red, like a blush. Night sweats are hot flashes that happen while you are sleeping or trying to sleep.  Bone loss (osteoporosis). The body loses calcium more quickly after menopause, causing the bones to become weaker. This can increase the risk for bone breaks (fractures).  Vaginal dryness. The lining of the vagina can become thin and dry, which can cause pain during sex or cause infection, burning, or itching.  Urinary tract infections.  Urinary incontinence. This is the inability to control  when you pass urine.  Irritability.  Short-term memory problems. What are the risks of HRT? Risks of HRT vary depending on your individual health and medical history. Risks of HRT also depend on whether you receive both estrogen and progestin or you receive estrogen only. HRT Fitzpatrick increase the risk of:  Spotting. This is when a small amount of blood leaks from the vagina unexpectedly.  Endometrial cancer. This cancer is in the lining of the uterus (endometrium).  Breast cancer.  Increased density of breast tissue. This can make it harder to find breast cancer on a breast X-ray (mammogram).  Stroke.  Heart disease.  Blood clots.  Gallbladder disease.  Liver disease. Risks of HRT can increase if you have any of the following conditions:  Endometrial cancer.  Liver disease.  Heart disease.  Breast cancer.  History of blood clots.  History of stroke. Follow these instructions at home:  Take over-the-counter and prescription medicines only as told by your health care provider.  Get mammograms, pelvic exams, and medical checkups as often as told by your health care provider.  Have Pap tests done as often as told by your health care provider. A Pap test is sometimes called a Pap smear. It is a screening test that is used to check for signs of cancer of the cervix and vagina. A Pap test can also identify the presence of infection or precancerous changes. Pap tests Arzuaga be done: ? Every 3 years, starting at age 73. ? Every 5 years, starting after age 28, in combination with testing for human papillomavirus (HPV). ? More often or less often depending on other medical conditions you have, your age, and other risk factors.  It is up to you to get the results of your Pap test. Ask your health care provider, or the department that is doing the test, when your results will be ready.  Keep all follow-up visits as told by your health care provider. This is important. Contact a health  care provider if you have:  Pain or swelling in your legs.  Shortness of breath.  Chest pain.  Lumps or changes in your breasts or armpits.  Slurred speech.  Pain, burning, or bleeding when you urinate.  Unusual vaginal bleeding.  Dizziness or headaches.  Weakness or numbness in any part of your arms or legs.  Pain in your abdomen. Summary  Menopause is a normal time of life when menstrual periods stop completely and the ovaries stop producing the female hormones estrogen and progesterone.  Hormone replacement therapy (HRT) can relieve some of the symptoms of menopause.  HRT can reduce the frequency and severity of menopausal symptoms.  Risks of HRT vary depending on your individual health and medical history. This information is not intended to replace advice given to you by your health care provider. Make sure you discuss any questions you have with your health care provider. Document Revised: 12/25/2017 Document Reviewed: 12/25/2017 Elsevier Patient Education  2020 [employer].

## 2019-07-08 LAB — CYTOLOGY - PAP
Comment: NEGATIVE
Diagnosis: NEGATIVE
High risk HPV: NEGATIVE

## 2019-07-22 ENCOUNTER — Telehealth: Payer: Self-pay | Admitting: Certified Nurse Midwife

## 2019-07-22 NOTE — Telephone Encounter (Signed)
Patient has a question about her estradiol prescription.

## 2019-07-23 NOTE — Telephone Encounter (Signed)
Spoke with patient, patient is requesting Vivelle-Dot 0.0375mg  patch Rx be sent as generic.   Advised patient Rx sent on 07/07/19 sent can be filled as generic or brand. Patient states she requested RX be filled as generic, estradiol, due to cost. States pharmacy needs authorization from provider to be filled as generic. Advised RN will call to check on Rx and return call. Patient agreeable.   Call placed to [facility], spoke with Jeneen Rinks. Was advised insurance covers brand only. Reviewed option of using [facility] for estradiol 0.0375 mg patch, cost $34.19, can have Rx ready this afternoon.

## 2019-07-23 NOTE — Telephone Encounter (Signed)
Spoke with patient, advised as seen below. Patient verbalizes understanding and is agreeable.   Routing to provider for final review. Patient is agreeable to disposition. Will close encounter.

## 2019-07-23 NOTE — Telephone Encounter (Signed)
Left message to call Sharnita Bogucki, RN at [facility] [phone].   

## 2019-07-28 ENCOUNTER — Encounter: Payer: Self-pay | Admitting: Certified Nurse Midwife

## 2019-09-15 ENCOUNTER — Other Ambulatory Visit: Payer: Self-pay | Admitting: *Deleted

## 2019-09-15 DIAGNOSIS — N951 Menopausal and female climacteric states: Secondary | ICD-10-CM

## 2019-09-15 DIAGNOSIS — Z79890 Hormone replacement therapy (postmenopausal): Secondary | ICD-10-CM

## 2019-09-15 NOTE — Telephone Encounter (Signed)
Medication refill request: Estradiol 0.0375 patches  Last AEX:  07-07-19 DL  Next AEX: not currently scheduled  Last MMG (if hormonal medication request): 03-19-2019 density B/BIRADS 2 benign  Refill authorized: Today, please advise.   Medication pended for #24, 2RF. Please refill if appropriate.

## 2019-09-16 MED ORDER — ESTRADIOL 0.0375 MG/24HR TD PTTW: MEDICATED_PATCH | TRANSDERMAL | 2 refills | Status: DC

## 2019-10-08 ENCOUNTER — Other Ambulatory Visit: Payer: Self-pay | Admitting: Obstetrics & Gynecology

## 2019-10-08 DIAGNOSIS — Z79890 Hormone replacement therapy (postmenopausal): Secondary | ICD-10-CM

## 2019-10-08 DIAGNOSIS — N951 Menopausal and female climacteric states: Secondary | ICD-10-CM

## 2019-10-08 NOTE — Telephone Encounter (Deleted)
Medication refill request: Estradiol patch Last AEX:  07/07/19 DL Next AEX: *** Last MMG (if hormonal medication request): *** Refill authorized: ***

## 2020-06-13 ENCOUNTER — Other Ambulatory Visit: Payer: Self-pay | Admitting: Obstetrics and Gynecology

## 2020-06-13 DIAGNOSIS — N951 Menopausal and female climacteric states: Secondary | ICD-10-CM

## 2020-06-13 DIAGNOSIS — Z79890 Hormone replacement therapy (postmenopausal): Secondary | ICD-10-CM

## 2020-06-14 NOTE — Telephone Encounter (Signed)
Medication refill request: vivelle-dot Last AEX:  07-07-19 DL Next AEX: note to pharmacy for patient to schedule Last MMG (if hormonal medication request): 03-19-2019 density b/birads 2 benign  Refill authorized: Today, please advise.   Medication pended for #8, 0RF. Please refill if appropriate.

## 2020-06-23 ENCOUNTER — Other Ambulatory Visit: Payer: Self-pay | Admitting: Obstetrics & Gynecology

## 2020-06-23 DIAGNOSIS — Z1231 Encounter for screening mammogram for malignant neoplasm of breast: Secondary | ICD-10-CM

## 2020-07-15 ENCOUNTER — Telehealth: Payer: Self-pay | Admitting: *Deleted

## 2020-07-15 DIAGNOSIS — Z79890 Hormone replacement therapy (postmenopausal): Secondary | ICD-10-CM

## 2020-07-15 DIAGNOSIS — N951 Menopausal and female climacteric states: Secondary | ICD-10-CM

## 2020-07-15 MED ORDER — ESTRADIOL 0.0375 MG/24HR TD PTTW: MEDICATED_PATCH | TRANSDERMAL | 0 refills | Status: DC

## 2020-07-15 NOTE — Telephone Encounter (Signed)
Patient has annual exam scheduled on 08/09/20, needs refill on estradiol patch only. Rx sent, left message on voicemail this has been done.

## 2020-08-06 ENCOUNTER — Other Ambulatory Visit: Payer: Self-pay | Admitting: Obstetrics & Gynecology

## 2020-08-06 DIAGNOSIS — N951 Menopausal and female climacteric states: Secondary | ICD-10-CM

## 2020-08-06 DIAGNOSIS — Z79890 Hormone replacement therapy (postmenopausal): Secondary | ICD-10-CM

## 2020-08-06 NOTE — Telephone Encounter (Signed)
Medication refill request: Vivelle dot  Last AEX:  07-07-19 DL  Next AEX: 09-30-20 ML Last MMG (if hormonal medication request): 03-19-2019 density B/BIRADS 2 benign, scheduled 08-12-20 Refill authorized: Today, please advise.    Pharmacy requests 90 day supply. Please advise.

## 2020-08-09 ENCOUNTER — Ambulatory Visit: Payer: Self-pay | Admitting: Obstetrics & Gynecology

## 2020-08-12 ENCOUNTER — Other Ambulatory Visit: Payer: Self-pay

## 2020-08-12 ENCOUNTER — Inpatient Hospital Stay: Admission: RE | Admit: 2020-08-12 | Payer: No Typology Code available for payment source | Source: Ambulatory Visit

## 2020-08-12 ENCOUNTER — Telehealth: Payer: Self-pay

## 2020-08-12 DIAGNOSIS — Z79890 Hormone replacement therapy (postmenopausal): Secondary | ICD-10-CM

## 2020-08-12 DIAGNOSIS — N951 Menopausal and female climacteric states: Secondary | ICD-10-CM

## 2020-08-12 MED ORDER — ESTRADIOL 0.0375 MG/24HR TD PTTW: MEDICATED_PATCH | TRANSDERMAL | 1 refills | Status: DC

## 2020-08-12 NOTE — Telephone Encounter (Signed)
Last AEX 07/07/19 with Jarvis Newcomer, CNM Scheduled AEX with ML for 09/30/20 (we r/s it once)   Needs refill on Estradiol patch until visit.

## 2020-08-16 ENCOUNTER — Other Ambulatory Visit: Payer: Self-pay

## 2020-08-16 ENCOUNTER — Ambulatory Visit
Admission: RE | Admit: 2020-08-16 | Discharge: 2020-08-16 | Disposition: A | Discharge status: Home / Self Care | Payer: 59 | Source: Ambulatory Visit | Attending: Obstetrics & Gynecology | Admitting: Obstetrics & Gynecology

## 2020-08-16 DIAGNOSIS — Z1231 Encounter for screening mammogram for malignant neoplasm of breast: Secondary | ICD-10-CM

## 2020-09-30 ENCOUNTER — Ambulatory Visit (INDEPENDENT_AMBULATORY_CARE_PROVIDER_SITE_OTHER): Payer: 59 | Admitting: Obstetrics & Gynecology

## 2020-09-30 ENCOUNTER — Encounter: Payer: Self-pay | Admitting: Obstetrics & Gynecology

## 2020-09-30 ENCOUNTER — Other Ambulatory Visit: Payer: Self-pay

## 2020-09-30 VITALS — BP 128/80 | Ht 66.0 in | Wt 179.0 lb

## 2020-09-30 DIAGNOSIS — D241 Benign neoplasm of right breast: Secondary | ICD-10-CM

## 2020-09-30 DIAGNOSIS — Z79890 Hormone replacement therapy (postmenopausal): Secondary | ICD-10-CM

## 2020-09-30 DIAGNOSIS — Z01419 Encounter for gynecological examination (general) (routine) without abnormal findings: Secondary | ICD-10-CM

## 2020-09-30 MED ORDER — ESTRADIOL 0.025 MG/24HR TD PTTW: 1.0000 | MEDICATED_PATCH | TRANSDERMAL | 4 refills | Status: DC

## 2020-09-30 MED ORDER — PROGESTERONE MICRONIZED 100 MG PO CAPS: 100.0000 mg | ORAL_CAPSULE | Freq: Every day | ORAL | 4 refills | Status: DC

## 2020-09-30 NOTE — Progress Notes (Signed)
Vanessa Hopkins December 06, 1965 [id number]   History:    55 y.o.  G74P4L4 Married  RP:  Established patient for annual exam  HPI:  Postmenopausal well on Estradiol 0.0375 twice a week and Prometrium 100 mg HS.  No PMB.  No pelvic pain.  No pain with IC. Uses Coconut oil for vaginal dryness.  Breasts normal.  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 Doing SBE monthly. Denies any bladder or bowel changes. No other health issues today.  BMI 28.89.  Good fitness and healthy nutrition.  Will see Fam MD for health labs and schedule Colono.   Past medical history,surgical history, family history and social history were all reviewed and documented in the EPIC chart.  Gynecologic History No LMP recorded. Patient has had an ablation.  Obstetric History OB History  Gravida Para Term Preterm AB Living  4 4       4   SAB IAB Ectopic Multiple Live Births               # Outcome Date GA Lbr Len/2nd Weight Sex Delivery Anes PTL Lv  4 Para           3 Para           2 Para           1 Para             Obstetric Comments  1st Menstrual Cycle:  12  1st Pregnancy:  22     ROS: A ROS was performed and pertinent positives and negatives are included in the history.  GENERAL: No fevers or chills. HEENT: No change in vision, no earache, sore throat or sinus congestion. NECK: No pain or stiffness. CARDIOVASCULAR: No chest pain or pressure. No palpitations. PULMONARY: No shortness of breath, cough or wheeze. GASTROINTESTINAL: No abdominal pain, nausea, vomiting or diarrhea, melena or bright red blood per rectum. GENITOURINARY: No urinary frequency, urgency, hesitancy or dysuria. MUSCULOSKELETAL: No joint or muscle pain, no back pain, no recent trauma. DERMATOLOGIC: No rash, no itching, no lesions. ENDOCRINE: No polyuria, polydipsia, no heat or cold intolerance. No recent change in weight. HEMATOLOGICAL: No anemia or easy bruising or bleeding. NEUROLOGIC: No headache, seizures, numbness,  tingling or weakness. PSYCHIATRIC: No depression, no loss of interest in normal activity or change in sleep pattern.     Exam:   BP 128/80 (BP Location: Right Arm, Patient Position: Sitting, Cuff Size: Normal)   Ht 5\' 6"  (1.676 m)   Wt 179 lb (81.2 kg)   BMI 28.89 kg/m   Body mass index is 28.89 kg/m.  General appearance : Well developed well nourished female. No acute distress HEENT: Eyes: no retinal hemorrhage or exudates,  Neck supple, trachea midline, no carotid bruits, no thyroidmegaly Lungs: Clear to auscultation, no rhonchi or wheezes, or rib retractions  Heart: Regular rate and rhythm, no murmurs or gallops Breast:Examined in sitting and supine position were symmetrical in appearance, no palpable masses or tenderness,  no skin retraction, no nipple inversion, no nipple discharge, no skin discoloration, no axillary or supraclavicular lymphadenopathy Abdomen: no palpable masses or tenderness, no rebound or guarding Extremities: no edema or skin discoloration or tenderness  Pelvic: Vulva: Normal             Vagina: No gross lesions or discharge  Cervix: No gross lesions or discharge  Uterus  AV, normal size, shape and consistency, non-tender and mobile  Adnexa  Without masses or tenderness  Anus: Normal  Assessment/Plan:  55 y.o. female for annual exam   1. Well woman exam with routine gynecological exam Normal gynecologic exam in menopause.  Pap test in March 2021 was negative, no indication to repeat this year.  Breast exam normal.  Screening mammogram April 2022 was negative with stable fibroadenoma of the right breast.  Will do health labs and organize colonoscopy through family physician.  Body mass index 28.89.  Continue with fitness and healthy nutrition.  2. Postmenopausal hormone replacement therapy Well on hormone replacement therapy for about 6 years.  No contraindication to continue.  Decision to decrease the estradiol patch to 0.025 twice a week.  Continue with  Prometrium 100 mg at bedtime.  Prescription sent to pharmacy.  Vitamin D supplements, calcium intake of 1.5 g/day total and regular weightbearing physical activity is recommended.  3. Breast fibroadenoma in female, right Stable on last screening mammogram.  Other orders - estradiol (VIVELLE-DOT) 0.025 MG/24HR; Place 1 patch onto the skin 2 (two) times a week. - progesterone (PROMETRIUM) 100 MG capsule; Take 1 capsule (100 mg total) by mouth at bedtime.  Princess Bruins MD, 3:22 PM 09/30/2020

## 2020-10-01 ENCOUNTER — Inpatient Hospital Stay: Admission: RE | Admit: 2020-10-01 | Payer: Self-pay | Source: Ambulatory Visit

## 2020-10-08 ENCOUNTER — Other Ambulatory Visit: Payer: Self-pay | Admitting: Obstetrics & Gynecology

## 2020-10-08 DIAGNOSIS — Z79890 Hormone replacement therapy (postmenopausal): Secondary | ICD-10-CM

## 2020-10-08 DIAGNOSIS — N951 Menopausal and female climacteric states: Secondary | ICD-10-CM

## 2021-07-19 ENCOUNTER — Other Ambulatory Visit: Payer: Self-pay | Admitting: Obstetrics & Gynecology

## 2021-07-19 DIAGNOSIS — Z1231 Encounter for screening mammogram for malignant neoplasm of breast: Secondary | ICD-10-CM

## 2021-09-02 ENCOUNTER — Ambulatory Visit
Admission: RE | Admit: 2021-09-02 | Discharge: 2021-09-02 | Disposition: A | Discharge status: Home / Self Care | Payer: 59 | Source: Ambulatory Visit | Attending: Obstetrics & Gynecology | Admitting: Obstetrics & Gynecology

## 2021-09-02 DIAGNOSIS — Z1231 Encounter for screening mammogram for malignant neoplasm of breast: Secondary | ICD-10-CM

## 2021-10-06 ENCOUNTER — Ambulatory Visit: Payer: 59 | Admitting: Obstetrics & Gynecology

## 2021-11-21 ENCOUNTER — Ambulatory Visit: Payer: 59 | Admitting: Obstetrics & Gynecology

## 2021-12-08 ENCOUNTER — Other Ambulatory Visit: Payer: Self-pay | Admitting: Obstetrics & Gynecology

## 2021-12-08 NOTE — Telephone Encounter (Signed)
Medication refill request: estradiol patch  Last AEX:  09/30/20 Next AEX: 12/20/21 Last MMG (if hormonal medication request): 09/02/21 normal  Refill authorized: #8 with 0 refills pended for today.

## 2021-12-20 ENCOUNTER — Encounter: Payer: Self-pay | Admitting: Obstetrics & Gynecology

## 2021-12-20 ENCOUNTER — Ambulatory Visit (INDEPENDENT_AMBULATORY_CARE_PROVIDER_SITE_OTHER): Payer: 59 | Admitting: Obstetrics & Gynecology

## 2021-12-20 VITALS — BP 118/72 | HR 66 | Ht 66.5 in | Wt 188.0 lb

## 2021-12-20 DIAGNOSIS — Z01419 Encounter for gynecological examination (general) (routine) without abnormal findings: Secondary | ICD-10-CM

## 2021-12-20 DIAGNOSIS — Z79890 Hormone replacement therapy (postmenopausal): Secondary | ICD-10-CM | POA: Diagnosis not present

## 2021-12-20 MED ORDER — ESTRADIOL 0.025 MG/24HR TD PTTW
1.0000 | MEDICATED_PATCH | TRANSDERMAL | 4 refills | Status: DC
Start: 2021-12-22 — End: 2023-02-27

## 2021-12-20 MED ORDER — PROGESTERONE MICRONIZED 100 MG PO CAPS: 100.0000 mg | ORAL_CAPSULE | Freq: Every day | ORAL | 4 refills | Status: DC

## 2021-12-20 NOTE — Progress Notes (Signed)
Vanessa Hopkins 02/07/1966 [id number]   History:    56 y.o. G4P4L4 Married   RP:  Established patient for annual exam   HPI:  Postmenopausal well on Estradiol 0.0375 twice a week and Prometrium 100 mg HS.  No PMB.  No pelvic pain.  No pain with IC. Uses Coconut oil for vaginal dryness.  No h/o abnormal Pap.  Pap Neg 07/2019.  Repeat Pap at 3 years.  Breasts normal.  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 Doing SBE monthly. Denies any bladder or bowel changes. No other health issues today.  BMI 29.89.  Good fitness and healthy nutrition.  Will see Fam MD for health labs and schedule Colono with Dr Collene Mares.   Past medical history,surgical history, family history and social history were all reviewed and documented in the EPIC chart.  Gynecologic History No LMP recorded. Patient is postmenopausal.  Obstetric History OB History  Gravida Para Term Preterm AB Living  '4 4       4  '$ SAB IAB Ectopic Multiple Live Births               # Outcome Date GA Lbr Len/2nd Weight Sex Delivery Anes PTL Lv  4 Para           3 Para           2 Para           1 Para             Obstetric Comments  1st Menstrual Cycle:  12  1st Pregnancy:  22     ROS: A ROS was performed and pertinent positives and negatives are included in the history.  GENERAL: No fevers or chills. HEENT: No change in vision, no earache, sore throat or sinus congestion. NECK: No pain or stiffness. CARDIOVASCULAR: No chest pain or pressure. No palpitations. PULMONARY: No shortness of breath, cough or wheeze. GASTROINTESTINAL: No abdominal pain, nausea, vomiting or diarrhea, melena or bright red blood per rectum. GENITOURINARY: No urinary frequency, urgency, hesitancy or dysuria. MUSCULOSKELETAL: No joint or muscle pain, no back pain, no recent trauma. DERMATOLOGIC: No rash, no itching, no lesions. ENDOCRINE: No polyuria, polydipsia, no heat or cold intolerance. No recent change in weight. HEMATOLOGICAL: No  anemia or easy bruising or bleeding. NEUROLOGIC: No headache, seizures, numbness, tingling or weakness. PSYCHIATRIC: No depression, no loss of interest in normal activity or change in sleep pattern.     Exam:   BP 118/72   Pulse 66   Ht 5' 6.5" (1.689 m)   Wt 188 lb (85.3 kg)   SpO2 98%   BMI 29.89 kg/m   Body mass index is 29.89 kg/m.  General appearance : Well developed well nourished female. No acute distress HEENT: Eyes: no retinal hemorrhage or exudates,  Neck supple, trachea midline, no carotid bruits, no thyroidmegaly Lungs: Clear to auscultation, no rhonchi or wheezes, or rib retractions  Heart: Regular rate and rhythm, no murmurs or gallops Breast:Examined in sitting and supine position were symmetrical in appearance, no palpable masses or tenderness,  no skin retraction, no nipple inversion, no nipple discharge, no skin discoloration, no axillary or supraclavicular lymphadenopathy Abdomen: no palpable masses or tenderness, no rebound or guarding Extremities: no edema or skin discoloration or tenderness  Pelvic: Vulva: Normal             Vagina: No gross lesions or discharge  Cervix: No gross lesions or discharge  Uterus  AV, normal size,  shape and consistency, non-tender and mobile  Adnexa  Without masses or tenderness  Anus: Normal   Assessment/Plan:  56 y.o. female for annual exam   1. Well woman exam with routine gynecological exam Postmenopausal well on Estradiol 0.0375 twice a week and Prometrium 100 mg HS.  No PMB.  No pelvic pain.  No pain with IC. Uses Coconut oil for vaginal dryness.  No h/o abnormal Pap.  Pap Neg 07/2019.  Repeat Pap at 3 years.  Breasts normal.  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 Doing SBE monthly. Denies any bladder or bowel changes. No other health issues today.  BMI 29.89.  Good fitness and healthy nutrition.  Will see Fam MD for health labs and schedule Colono with Dr Collene Mares.  2. Postmenopausal hormone  replacement therapy Postmenopausal well on Estradiol 0.0375 twice a week and Prometrium 100 mg HS.  No PMB.  No pelvic pain.  No pain with IC. Uses Coconut oil for vaginal dryness.   Other orders - VITAMIN K PO; Take by mouth. - ketoconazole (NIZORAL) 2 % shampoo; Apply topically as needed. - hydrocortisone 2.5 % ointment; Apply topically. - progesterone (PROMETRIUM) 100 MG capsule; Take 1 capsule (100 mg total) by mouth at bedtime. - estradiol (VIVELLE-DOT) 0.025 MG/24HR; Place 1 patch onto the skin 2 (two) times a week.   Princess Bruins MD, 2:49 PM 12/20/2021

## 2022-03-20 ENCOUNTER — Telehealth: Payer: Self-pay | Admitting: *Deleted

## 2022-03-20 DIAGNOSIS — R7989 Other specified abnormal findings of blood chemistry: Secondary | ICD-10-CM

## 2022-03-20 NOTE — Telephone Encounter (Signed)
Patient called and left detailed message in triage voicemail that she had labs done at [facility] and her testosterone was elevated twice( came back for repeat) . The provider at [facility] recommend she get imaging? I left a message on patient voicemail to have labs faxed to office for you to review how you want to proceed. Once I receive the labs I will give to [facility].

## 2022-03-21 NOTE — Telephone Encounter (Signed)
Dr.Lavoie please see the below. Patient is already scheduled for office visit on 04/07/22 to discuss. So want her to keep this visit to discuss or do you want to go ahead and schedule pelvic ultrasound without seeing patient first?  Please advise

## 2022-03-24 NOTE — Telephone Encounter (Addendum)
Patient informed. Appointment canceled on 04/07/22 per patient request.   Dr.Lavoie for the CT scan what is diagnosis? abnormal testosterone level?

## 2022-03-27 NOTE — Telephone Encounter (Signed)
Dr. Julieta Bellini replied "Yes, high Testo.  R/O Adrenal tumor."  I called  imaging and spoke with Shyann (CT tech) regarding the correct order. Shyann told me to order Ct abdomen w/wo contrast and enter "adrenal protocol"  Order placed at [facility] imaging they will call to schedule.

## 2022-03-28 NOTE — Telephone Encounter (Signed)
Patient scheduled for CT scan on 04/13/22 at [facility] location.

## 2022-04-05 ENCOUNTER — Ambulatory Visit: Payer: 59 | Admitting: Obstetrics & Gynecology

## 2022-04-07 ENCOUNTER — Ambulatory Visit: Payer: 59 | Admitting: Obstetrics & Gynecology

## 2022-04-13 ENCOUNTER — Telehealth: Payer: Self-pay | Admitting: *Deleted

## 2022-04-13 ENCOUNTER — Other Ambulatory Visit: Payer: 59

## 2022-04-13 NOTE — Telephone Encounter (Signed)
Call to patient. Left detailed message, ok per dpr. Requesting copy of recent testosterone labs. CT Abdomen w/wo contrast ordered for elevated testosterone level to r/o adrenal tumor. Advised patient to have labs faxed to [facility]: Rhaelyn Giron H at [phone] ASAP. Return call to [place] at [phone] if any additional questions.    [facility] requesting return call regarding studies ordered by Dr. Dellis Filbert [phone] Case #[id number]

## 2022-04-14 ENCOUNTER — Encounter: Payer: Self-pay | Admitting: *Deleted

## 2022-04-14 NOTE — Telephone Encounter (Signed)
Authorization received from [facility] for CT.  Lab received from [facility].   Call placed to patient, left detailed message. Advised CT scan approved, valid through 05/26/22. Please contact imaging location or insurance regarding OOP cost if needed. You can return call to office if any additional questions.   Encounter closed.

## 2022-04-20 ENCOUNTER — Ambulatory Visit
Admission: RE | Admit: 2022-04-20 | Discharge: 2022-04-20 | Disposition: A | Discharge status: Home / Self Care | Payer: 59 | Source: Ambulatory Visit | Attending: Obstetrics & Gynecology | Admitting: Obstetrics & Gynecology

## 2022-04-20 DIAGNOSIS — R7989 Other specified abnormal findings of blood chemistry: Secondary | ICD-10-CM

## 2022-04-20 MED ORDER — IOPAMIDOL (ISOVUE-300) INJECTION 61%
100.0000 mL | Freq: Once | INTRAVENOUS | Status: AC | PRN
  Administered 2022-04-20: 100 mL via INTRAVENOUS

## 2022-08-25 ENCOUNTER — Other Ambulatory Visit: Payer: Self-pay | Admitting: Obstetrics & Gynecology

## 2022-08-25 DIAGNOSIS — Z1231 Encounter for screening mammogram for malignant neoplasm of breast: Secondary | ICD-10-CM

## 2022-09-12 ENCOUNTER — Ambulatory Visit: Payer: 59 | Admitting: Obstetrics & Gynecology

## 2022-11-03 ENCOUNTER — Ambulatory Visit: Payer: 59

## 2022-11-30 ENCOUNTER — Ambulatory Visit
Admission: RE | Admit: 2022-11-30 | Discharge: 2022-11-30 | Disposition: A | Discharge status: Home / Self Care | Payer: Managed Care, Other (non HMO) | Source: Ambulatory Visit | Attending: Obstetrics & Gynecology | Admitting: Obstetrics & Gynecology

## 2022-11-30 DIAGNOSIS — Z1231 Encounter for screening mammogram for malignant neoplasm of breast: Secondary | ICD-10-CM

## 2022-12-04 ENCOUNTER — Other Ambulatory Visit: Payer: Self-pay | Admitting: Obstetrics & Gynecology

## 2022-12-04 DIAGNOSIS — R928 Other abnormal and inconclusive findings on diagnostic imaging of breast: Secondary | ICD-10-CM

## 2022-12-19 ENCOUNTER — Other Ambulatory Visit: Payer: Self-pay | Admitting: Nurse Practitioner

## 2022-12-19 ENCOUNTER — Ambulatory Visit
Admission: RE | Admit: 2022-12-19 | Discharge: 2022-12-19 | Disposition: A | Discharge status: Home / Self Care | Payer: Managed Care, Other (non HMO) | Source: Ambulatory Visit | Attending: Obstetrics & Gynecology | Admitting: Obstetrics & Gynecology

## 2022-12-19 DIAGNOSIS — R928 Other abnormal and inconclusive findings on diagnostic imaging of breast: Secondary | ICD-10-CM

## 2022-12-19 DIAGNOSIS — N6489 Other specified disorders of breast: Secondary | ICD-10-CM

## 2022-12-20 ENCOUNTER — Ambulatory Visit
Admission: RE | Admit: 2022-12-20 | Discharge: 2022-12-20 | Disposition: A | Discharge status: Home / Self Care | Payer: Managed Care, Other (non HMO) | Source: Ambulatory Visit | Attending: Nurse Practitioner

## 2022-12-20 ENCOUNTER — Ambulatory Visit
Admission: RE | Admit: 2022-12-20 | Discharge: 2022-12-20 | Disposition: A | Discharge status: Home / Self Care | Payer: Managed Care, Other (non HMO) | Source: Ambulatory Visit | Attending: Nurse Practitioner | Admitting: Nurse Practitioner

## 2022-12-20 DIAGNOSIS — R928 Other abnormal and inconclusive findings on diagnostic imaging of breast: Secondary | ICD-10-CM

## 2022-12-20 DIAGNOSIS — N6489 Other specified disorders of breast: Secondary | ICD-10-CM

## 2022-12-20 HISTORY — PX: BREAST BIOPSY: SHX20

## 2023-01-12 ENCOUNTER — Other Ambulatory Visit: Payer: Self-pay | Admitting: Surgery

## 2023-01-12 DIAGNOSIS — N6489 Other specified disorders of breast: Secondary | ICD-10-CM

## 2023-01-17 ENCOUNTER — Other Ambulatory Visit: Payer: Self-pay | Admitting: Surgery

## 2023-01-17 DIAGNOSIS — N6489 Other specified disorders of breast: Secondary | ICD-10-CM

## 2023-01-27 ENCOUNTER — Other Ambulatory Visit: Payer: Self-pay | Admitting: Obstetrics & Gynecology

## 2023-01-27 DIAGNOSIS — Z79890 Hormone replacement therapy (postmenopausal): Secondary | ICD-10-CM

## 2023-01-29 NOTE — Telephone Encounter (Signed)
Pt also LVM in triage line stating that she left bottle of previous rx in hotel out of town and needs new, has not taken since Friday. Still has plenty of estrogen.  Med refill request: Progesterone Last AEX: 12/20/2021-ML Next AEX: 03/16/2023-JC Last MMG (if hormonal med): 11/30/2022-birads 0, B/L Dx/US 12/19/2022-birads 4 (Rt Breast distortion w/ sonographic correlate, bx recommended w/ no rt axillary lymphadenopathy), Rt Breast Bx 12/20/2022-surgery scheduled in 02/2023 Refill authorized: rx pend.

## 2023-01-29 NOTE — Telephone Encounter (Signed)
I recommend discontinuation of her Vivelle Dot and Prometrium for the time being.   She is undergoing breast surgery.   I would recommend re-evaluating after her final pathology report is back.

## 2023-01-29 NOTE — Telephone Encounter (Signed)
Pt notified and voiced understanding and appreciation for the call and concern.   Will reach out with any changes/continued non-manageable PM sxs after final path report.   Routing to provider for final review and closing encounter.

## 2023-02-07 NOTE — Pre-Procedure Instructions (Signed)
Surgical Instructions   Your procedure is scheduled on February 14, 2023. Report to [facility] Main Entrance "A" at 6:30 A.M., then check in with the Admitting office. Any questions or running late day of surgery: call [phone]  Questions prior to your surgery date: call [phone], Monday-Friday, 8am-4pm. If you experience any cold or flu symptoms such as cough, fever, chills, shortness of breath, etc. between now and your scheduled surgery, please notify us at the above number.     Remember:  Do not eat after midnight the night before your surgery   You Macho drink clear liquids until 5:30 AM the morning of your surgery.   Clear liquids allowed are: Water, Non-Citrus Juices (without pulp), Carbonated Beverages, Clear Tea, Black Coffee Only (NO MILK, CREAM OR POWDERED CREAMER of any kind), and Gatorade.  Patient Instructions  The night before surgery:  No food after midnight. ONLY clear liquids after midnight  The day of surgery (if you do NOT have diabetes):  Drink ONE (1) Pre-Surgery Clear Ensure by 5:30 AM the morning of surgery. Drink in one sitting. Do not sip.  This drink was given to you during your hospital  pre-op appointment visit.  Nothing else to drink after completing the  Pre-Surgery Clear Ensure.         If you have questions, please contact your surgeon's office.     Take these medicines the morning of surgery with A SIP OF WATER: NONE   One week prior to surgery, STOP taking any Aspirin (unless otherwise instructed by your surgeon) Aleve, Naproxen, Ibuprofen, Motrin, Advil, Goody's, BC's, all herbal medications, fish oil, and non-prescription vitamins.                     Do NOT Smoke (Tobacco/Vaping) for 24 hours prior to your procedure.  If you use a CPAP at night, you Blumenthal bring your mask/headgear for your overnight stay.   You will be asked to remove any contacts, glasses, piercing's, hearing aid's, dentures/partials prior to surgery. Please bring  cases for these items if needed.    Patients discharged the day of surgery will not be allowed to drive home, and someone needs to stay with them for 24 hours.  SURGICAL WAITING ROOM VISITATION Patients Sames have no more than 2 support people in the waiting area - these visitors Hessling rotate.   Pre-op nurse will coordinate an appropriate time for 1 ADULT support person, who Lezotte not rotate, to accompany patient in pre-op.  Children under the age of 80 must have an adult with them who is not the patient and must remain in the main waiting area with an adult.  If the patient needs to stay at the hospital during part of their recovery, the visitor guidelines for inpatient rooms apply.  Please refer to the [facility] website for the visitor guidelines for any additional information.   If you received a COVID test during your pre-op visit  it is requested that you wear a mask when out in public, stay away from anyone that Shampine not be feeling well and notify your surgeon if you develop symptoms. If you have been in contact with anyone that has tested positive in the last 10 days please notify you surgeon.      Pre-operative CHG Bathing Instructions   You can play a key role in reducing the risk of infection after surgery. Your skin needs to be as free of germs as possible. You can reduce the  number of germs on your skin by washing with CHG (chlorhexidine gluconate) soap before surgery. CHG is an antiseptic soap that kills germs and continues to kill germs even after washing.   DO NOT use if you have an allergy to chlorhexidine/CHG or antibacterial soaps. If your skin becomes reddened or irritated, stop using the CHG and notify one of our RNs at [phone].              TAKE A SHOWER THE NIGHT BEFORE SURGERY AND THE DAY OF SURGERY    Please keep in mind the following:  DO NOT shave, including legs and underarms, 48 hours prior to surgery.   You Millan shave your face before/day of surgery.  Place  clean sheets on your bed the night before surgery Use a clean washcloth (not used since being washed) for each shower. DO NOT sleep with pet's night before surgery.  CHG Shower Instructions:  Wash your face and private area with normal soap. If you choose to wash your hair, wash first with your normal shampoo.  After you use shampoo/soap, rinse your hair and body thoroughly to remove shampoo/soap residue.  Turn the water OFF and apply half the bottle of CHG soap to a CLEAN washcloth.  Apply CHG soap ONLY FROM YOUR NECK DOWN TO YOUR TOES (washing for 3-5 minutes)  DO NOT use CHG soap on face, private areas, open wounds, or sores.  Pay special attention to the area where your surgery is being performed.  If you are having back surgery, having someone wash your back for you Durfey be helpful. Wait 2 minutes after CHG soap is applied, then you Riner rinse off the CHG soap.  Pat dry with a clean towel  Put on clean pajamas    Additional instructions for the day of surgery: DO NOT APPLY any lotions, deodorants, cologne, or perfumes.   Do not wear jewelry or makeup Do not wear nail polish, gel polish, artificial nails, or any other type of covering on natural nails (fingers and toes) Do not bring valuables to the hospital. [facility] is not responsible for valuables/personal belongings. Put on clean/comfortable clothes.  Please brush your teeth.  Ask your nurse before applying any prescription medications to the skin.

## 2023-02-08 ENCOUNTER — Encounter (HOSPITAL_COMMUNITY): Payer: Self-pay

## 2023-02-08 ENCOUNTER — Other Ambulatory Visit: Payer: Self-pay

## 2023-02-08 ENCOUNTER — Encounter (HOSPITAL_COMMUNITY)
Admission: RE | Admit: 2023-02-08 | Discharge: 2023-02-08 | Disposition: A | Discharge status: Home / Self Care | Payer: Managed Care, Other (non HMO) | Source: Ambulatory Visit | Attending: Surgery | Admitting: Surgery

## 2023-02-08 VITALS — BP 134/77 | HR 87 | Temp 98.4°F | Resp 18 | Ht 67.0 in | Wt 189.8 lb

## 2023-02-08 DIAGNOSIS — Z01818 Encounter for other preprocedural examination: Secondary | ICD-10-CM | POA: Diagnosis present

## 2023-02-08 DIAGNOSIS — Z01812 Encounter for preprocedural laboratory examination: Secondary | ICD-10-CM | POA: Insufficient documentation

## 2023-02-08 LAB — CBC
HCT: 44 % (ref 36.0–46.0)
Hemoglobin: 14.2 g/dL (ref 12.0–15.0)
MCH: 31.1 pg (ref 26.0–34.0)
MCHC: 32.3 g/dL (ref 30.0–36.0)
MCV: 96.5 fL (ref 80.0–100.0)
Platelets: 297 10*3/uL (ref 150–400)
RBC: 4.56 MIL/uL (ref 3.87–5.11)
RDW: 12.4 % (ref 11.5–15.5)
WBC: 6 10*3/uL (ref 4.0–10.5)
nRBC: 0 % (ref 0.0–0.2)

## 2023-02-08 NOTE — Progress Notes (Signed)
PCP - Was seeing Dr. Moshe Cipro at [facility], but MD left practice. Waiting to be assigned new PCP with same practice. Cardiologist - Denies  PPM/ICD - Denies Device Orders - n/a Rep Notified - n/a  Chest x-ray - Denies EKG - Denies Stress Test - Denies ECHO - Per pt, had one in her 20s/30s for CP, but testing was negative and pain was acid reflux. Cardiac Cath - Denies  Sleep Study - Denies CPAP - n/a  No DM  Last dose of GLP1 agonist- n/a GLP1 instructions: n/a  Blood Thinner Instructions: n/a Aspirin Instructions: n/a  ERAS Protcol - Clear liquids until 0530 morning of surgery PRE-SURGERY Ensure or G2- Ensure given to pt with instructions  COVID TEST- n/a   Anesthesia review: Yes. Breast seed placement   Patient denies shortness of breath, fever, cough and chest pain at PAT appointment. Pt denies any respiratory illness/infection last to months. Pt does endorse runny nose/congestion related to seasonal allergies.   All instructions explained to the patient, with a verbal understanding of the material. Patient agrees to go over the instructions while at home for a better understanding. Patient also instructed to self quarantine after being tested for COVID-19. The opportunity to ask questions was provided.

## 2023-02-13 ENCOUNTER — Ambulatory Visit
Admission: RE | Admit: 2023-02-13 | Discharge: 2023-02-13 | Disposition: A | Discharge status: Home / Self Care | Payer: Managed Care, Other (non HMO) | Source: Ambulatory Visit | Attending: Surgery | Admitting: Surgery

## 2023-02-13 DIAGNOSIS — N6489 Other specified disorders of breast: Secondary | ICD-10-CM

## 2023-02-13 HISTORY — PX: BREAST BIOPSY: SHX20

## 2023-02-13 NOTE — H&P (Signed)
REFERRING PHYSICIAN: Olivia Mackie, NP PROVIDER: Wayne Both, MD MRN: [id number] DOB: 1965-05-26  Subjective   Chief Complaint: NEW BREAST   History of Present Illness: Vanessa Hopkins is a 57 y.o. female who is seen as an office consultation for evaluation of abnormal mammogram  This is a pleasant 57 year old female who was found on recent screening mammography to have a distortion in the right breast. Ultrasound did not show a mass. She underwent a biopsy of the area in the upper outer quadrant which showed a complex sclerosing lesion. There was no evidence of malignancy. She has had a previous fibroadenoma removed from that breast. Her mother was recently diagnosed with breast cancer in her 70s. She denies nipple discharge. She is otherwise healthy without complaints. She has no cardiopulmonary issues.  Review of Systems: A complete review of systems was obtained from the patient. I have reviewed this information and discussed as appropriate with the patient. See HPI as well for other ROS.  ROS   Medical History: History reviewed. No pertinent past medical history.  There is no problem list on file for this patient.  Past Surgical History:  Procedure Laterality Date  CYSTECTOMY Right  Breast  ENDOMETRIAL ABLATION W/ NOVASURE    No Known Allergies  Current Outpatient Medications on File Prior to Visit  Medication Sig Dispense Refill  estradiol (DOTTI) patch 0.025 mg/24 hr PLACE 1 PATCH ONTO THE SKIN 2 TIMES A WEEK.  progesterone (PROMETRIUM) 100 MG capsule Take 100 mg by mouth at bedtime   No current facility-administered medications on file prior to visit.   Family History  Problem Relation Age of Onset  High blood pressure (Hypertension) Mother  Hyperlipidemia (Elevated cholesterol) Mother  Breast cancer Mother  Diabetes Father    Social History   Tobacco Use  Smoking Status Never  Smokeless Tobacco Never    Social History   Socioeconomic  History  Marital status: Married  Tobacco Use  Smoking status: Never  Smokeless tobacco: Never  Substance and Sexual Activity  Alcohol use: Yes  Drug use: Never   Objective:   Vitals:   BP: (!) 142/90  Pulse: 93  Temp: 37.1 C (98.7 F)  SpO2: 96%  Weight: 86.8 kg (191 lb 6.4 oz)  Height: 170.2 cm (5\' 7" )  PainSc: 0-No pain  PainLoc: Breast   Body mass index is 29.98 kg/m.  Physical Exam   She appears well on exam  There are no palpable breast masses. There is a well-healed scar around her areola of the right breast.  There is no axillary adenopathy  Labs, Imaging and Diagnostic Testing: I have reviewed her mammograms, ultrasound, and pathology results  Assessment and Plan:   Diagnoses and all orders for this visit:  Complex sclerosing lesion of right breast   I discussed the diagnosis with the patient and her husband and gave them a copy of the pathology report. We discussed the diagnosis and the reason to remove this area. This would allow complete histologic evaluation to rule out malignancy. There is a slight risk of an upgrade to a malignancy in the situations. Also, given her family history removal is recommended. I next discussed proceeding with a radioactive seed guided right breast lumpectomy. I explained the surgical procedure in detail. We discussed the risk which includes but is not limited to bleeding, infection, the need for further surgery if malignancy is found, cardiopulmonary issues with anesthesia, postoperative recovery, etc. They understand and wish to proceed with surgery which will be  scheduled

## 2023-02-13 NOTE — Anesthesia Preprocedure Evaluation (Signed)
Anesthesia Evaluation  Patient identified by MRN, date of birth, ID band Patient awake    Reviewed: Allergy & Precautions, NPO status , Patient's Chart, lab work & pertinent test results  History of Anesthesia Complications Negative for: history of anesthetic complications  Airway Mallampati: I  TM Distance: >3 FB Neck ROM: Full    Dental  (+) Dental Advisory Given   Pulmonary neg pulmonary ROS   Pulmonary exam normal breath sounds clear to auscultation       Cardiovascular negative cardio ROS  Rhythm:Regular Rate:Normal     Neuro/Psych  PSYCHIATRIC DISORDERS Anxiety     negative neurological ROS     GI/Hepatic negative GI ROS, Neg liver ROS,,,  Endo/Other  negative endocrine ROS    Renal/GU negative Renal ROS     Musculoskeletal   Abdominal   Peds  Hematology negative hematology ROS (+) Lab Results      Component                Value               Date                      WBC                      6.0                 02/08/2023                HGB                      14.2                02/08/2023                HCT                      44.0                02/08/2023                MCV                      96.5                02/08/2023                PLT                      297                 02/08/2023              Anesthesia Other Findings   Reproductive/Obstetrics Right breast sclerosing lesion                             Anesthesia Physical Anesthesia Plan  ASA: 2  Anesthesia Plan: General   Post-op Pain Management: Tylenol PO (pre-op)*   Induction: Intravenous  PONV Risk Score and Plan: 3 and Ondansetron, Dexamethasone and Treatment Stumph vary due to age or medical condition  Airway Management Planned: LMA  Additional Equipment:   Intra-op Plan:   Post-operative Plan: Extubation in OR  Informed Consent: I have reviewed the patients History and Physical, chart,  labs and discussed the procedure including the  risks, benefits and alternatives for the proposed anesthesia with the patient or authorized representative who has indicated his/her understanding and acceptance.     Dental advisory given  Plan Discussed with: CRNA and Anesthesiologist  Anesthesia Plan Comments: (Risks of general anesthesia discussed including, but not limited to, sore throat, hoarse voice, chipped/damaged teeth, injury to vocal cords, nausea and vomiting, allergic reactions, lung infection, heart attack, stroke, and death. All questions answered. )       Anesthesia Quick Evaluation

## 2023-02-14 ENCOUNTER — Ambulatory Visit
Admission: RE | Admit: 2023-02-14 | Discharge: 2023-02-14 | Disposition: A | Discharge status: Home / Self Care | Payer: Managed Care, Other (non HMO) | Source: Ambulatory Visit | Attending: Surgery | Admitting: Surgery

## 2023-02-14 ENCOUNTER — Other Ambulatory Visit: Payer: Self-pay

## 2023-02-14 ENCOUNTER — Encounter (HOSPITAL_COMMUNITY): Payer: Self-pay | Admitting: Surgery

## 2023-02-14 ENCOUNTER — Encounter (HOSPITAL_COMMUNITY)
Admission: RE | Disposition: A | Discharge status: Home / Self Care | Payer: Self-pay | Source: Home / Self Care | Attending: Surgery

## 2023-02-14 ENCOUNTER — Ambulatory Visit (HOSPITAL_COMMUNITY)
Admission: RE | Admit: 2023-02-14 | Discharge: 2023-02-14 | Disposition: A | Discharge status: Home / Self Care | Payer: Managed Care, Other (non HMO) | Attending: Surgery | Admitting: Surgery

## 2023-02-14 ENCOUNTER — Ambulatory Visit (HOSPITAL_BASED_OUTPATIENT_CLINIC_OR_DEPARTMENT_OTHER): Payer: Managed Care, Other (non HMO) | Admitting: Anesthesiology

## 2023-02-14 ENCOUNTER — Ambulatory Visit (HOSPITAL_COMMUNITY): Payer: Self-pay | Admitting: Physician Assistant

## 2023-02-14 DIAGNOSIS — N6459 Other signs and symptoms in breast: Secondary | ICD-10-CM

## 2023-02-14 DIAGNOSIS — N6489 Other specified disorders of breast: Secondary | ICD-10-CM | POA: Diagnosis present

## 2023-02-14 DIAGNOSIS — Z803 Family history of malignant neoplasm of breast: Secondary | ICD-10-CM | POA: Diagnosis not present

## 2023-02-14 HISTORY — PX: BREAST LUMPECTOMY WITH RADIOACTIVE SEED LOCALIZATION: SHX6424

## 2023-02-14 SURGERY — BREAST LUMPECTOMY WITH RADIOACTIVE SEED LOCALIZATION
Anesthesia: General | Site: Breast | Laterality: Right

## 2023-02-14 MED ORDER — CHLORHEXIDINE GLUCONATE CLOTH 2 % EX PADS: 6.0000 | MEDICATED_PAD | Freq: Once | CUTANEOUS | Status: DC

## 2023-02-14 MED ORDER — FENTANYL CITRATE (PF) 250 MCG/5ML IJ SOLN
INTRAMUSCULAR | Status: AC
  Filled 2023-02-14: qty 5

## 2023-02-14 MED ORDER — ORAL CARE MOUTH RINSE: 15.0000 mL | Freq: Once | OROMUCOSAL | Status: AC

## 2023-02-14 MED ORDER — BUPIVACAINE-EPINEPHRINE 0.25% -1:200000 IJ SOLN
INTRAMUSCULAR | Status: DC | PRN
  Administered 2023-02-14: 20 mL

## 2023-02-14 MED ORDER — ONDANSETRON HCL 4 MG/2ML IJ SOLN
INTRAMUSCULAR | Status: AC
  Filled 2023-02-14: qty 2

## 2023-02-14 MED ORDER — AMISULPRIDE (ANTIEMETIC) 5 MG/2ML IV SOLN: 10.0000 mg | Freq: Once | INTRAVENOUS | Status: DC | PRN

## 2023-02-14 MED ORDER — LIDOCAINE HCL (CARDIAC) PF 100 MG/5ML IV SOSY
PREFILLED_SYRINGE | INTRAVENOUS | Status: DC | PRN
Start: 2023-02-14 — End: 2023-02-14
  Administered 2023-02-14: 60 mg via INTRATRACHEAL

## 2023-02-14 MED ORDER — ONDANSETRON HCL 4 MG/2ML IJ SOLN
INTRAMUSCULAR | Status: DC | PRN
Start: 2023-02-14 — End: 2023-02-14
  Administered 2023-02-14: 4 mg via INTRAVENOUS

## 2023-02-14 MED ORDER — BUPIVACAINE-EPINEPHRINE (PF) 0.25% -1:200000 IJ SOLN
INTRAMUSCULAR | Status: AC
  Filled 2023-02-14: qty 30

## 2023-02-14 MED ORDER — FENTANYL CITRATE (PF) 100 MCG/2ML IJ SOLN
INTRAMUSCULAR | Status: DC | PRN
Start: 2023-02-14 — End: 2023-02-14
  Administered 2023-02-14: 50 ug via INTRAVENOUS

## 2023-02-14 MED ORDER — LACTATED RINGERS IV SOLN: INTRAVENOUS | Status: DC

## 2023-02-14 MED ORDER — CEFAZOLIN SODIUM-DEXTROSE 2-4 GM/100ML-% IV SOLN
2.0000 g | INTRAVENOUS | Status: AC
  Administered 2023-02-14: 2 g via INTRAVENOUS
  Filled 2023-02-14: qty 100

## 2023-02-14 MED ORDER — CHLORHEXIDINE GLUCONATE 0.12 % MT SOLN
15.0000 mL | Freq: Once | OROMUCOSAL | Status: AC
  Administered 2023-02-14: 15 mL via OROMUCOSAL
  Filled 2023-02-14: qty 15

## 2023-02-14 MED ORDER — 0.9 % SODIUM CHLORIDE (POUR BTL) OPTIME
TOPICAL | Status: DC | PRN
  Administered 2023-02-14: 1000 mL

## 2023-02-14 MED ORDER — DEXAMETHASONE SODIUM PHOSPHATE 10 MG/ML IJ SOLN
INTRAMUSCULAR | Status: DC | PRN
Start: 2023-02-14 — End: 2023-02-14
  Administered 2023-02-14: 10 mg via INTRAVENOUS

## 2023-02-14 MED ORDER — PROPOFOL 10 MG/ML IV BOLUS
INTRAVENOUS | Status: DC | PRN
Start: 2023-02-14 — End: 2023-02-14
  Administered 2023-02-14: 200 mg via INTRAVENOUS

## 2023-02-14 MED ORDER — LACTATED RINGERS IV SOLN
INTRAVENOUS | Status: DC | PRN
Start: 2023-02-14 — End: 2023-02-14

## 2023-02-14 MED ORDER — ENSURE PRE-SURGERY PO LIQD: 296.0000 mL | Freq: Once | ORAL | Status: DC

## 2023-02-14 MED ORDER — MIDAZOLAM HCL 5 MG/5ML IJ SOLN
INTRAMUSCULAR | Status: DC | PRN
Start: 2023-02-14 — End: 2023-02-14
  Administered 2023-02-14: 2 mg via INTRAVENOUS

## 2023-02-14 MED ORDER — PROPOFOL 10 MG/ML IV BOLUS
INTRAVENOUS | Status: AC
  Filled 2023-02-14: qty 20

## 2023-02-14 MED ORDER — ACETAMINOPHEN 500 MG PO TABS
1000.0000 mg | ORAL_TABLET | ORAL | Status: AC
  Administered 2023-02-14: 1000 mg via ORAL
  Filled 2023-02-14: qty 2

## 2023-02-14 MED ORDER — MIDAZOLAM HCL 2 MG/2ML IJ SOLN
INTRAMUSCULAR | Status: AC
  Filled 2023-02-14: qty 2

## 2023-02-14 MED ORDER — TRAMADOL HCL 50 MG PO TABS
50.0000 mg | ORAL_TABLET | Freq: Four times a day (QID) | ORAL | 0 refills | Status: DC | PRN
Start: 2023-02-14 — End: 2023-03-16

## 2023-02-14 MED ORDER — OXYCODONE HCL 5 MG/5ML PO SOLN: 5.0000 mg | Freq: Once | ORAL | Status: DC | PRN

## 2023-02-14 MED ORDER — DEXAMETHASONE SODIUM PHOSPHATE 10 MG/ML IJ SOLN
INTRAMUSCULAR | Status: AC
  Filled 2023-02-14: qty 1

## 2023-02-14 MED ORDER — OXYCODONE HCL 5 MG PO TABS: 5.0000 mg | ORAL_TABLET | Freq: Once | ORAL | Status: DC | PRN

## 2023-02-14 MED ORDER — FENTANYL CITRATE (PF) 100 MCG/2ML IJ SOLN: 25.0000 ug | INTRAMUSCULAR | Status: DC | PRN

## 2023-02-14 SURGICAL SUPPLY — 33 items
ADH SKN CLS APL DERMABOND .7 (GAUZE/BANDAGES/DRESSINGS) ×1
APL PRP STRL LF DISP 70% ISPRP (MISCELLANEOUS) ×1
APPLIER CLIP 9.375 MED OPEN (MISCELLANEOUS) ×1
APR CLP MED 9.3 20 MLT OPN (MISCELLANEOUS) ×1
BAG COUNTER SPONGE SURGICOUNT (BAG) ×1 IMPLANT
BAG SPNG CNTER NS LX DISP (BAG)
CANISTER SUCT 3000ML PPV (MISCELLANEOUS) ×1 IMPLANT
CHLORAPREP W/TINT 26 (MISCELLANEOUS) ×1 IMPLANT
CLIP APPLIE 9.375 MED OPEN (MISCELLANEOUS) ×1 IMPLANT
COVER PROBE W GEL 5X96 (DRAPES) ×1 IMPLANT
COVER SURGICAL LIGHT HANDLE (MISCELLANEOUS) ×1 IMPLANT
DERMABOND ADVANCED .7 DNX12 (GAUZE/BANDAGES/DRESSINGS) ×1 IMPLANT
DEVICE DUBIN SPECIMEN MAMMOGRA (MISCELLANEOUS) ×1 IMPLANT
DRAPE CHEST BREAST 15X10 FENES (DRAPES) ×1 IMPLANT
ELECT CAUTERY BLADE 6.4 (BLADE) ×1 IMPLANT
ELECT REM PT RETURN 9FT ADLT (ELECTROSURGICAL) ×1
ELECTRODE REM PT RTRN 9FT ADLT (ELECTROSURGICAL) ×1 IMPLANT
GLOVE SURG SIGNA 7.5 PF LTX (GLOVE) ×1 IMPLANT
GOWN STRL REUS W/ TWL LRG LVL3 (GOWN DISPOSABLE) ×1 IMPLANT
GOWN STRL REUS W/ TWL XL LVL3 (GOWN DISPOSABLE) ×1 IMPLANT
GOWN STRL REUS W/TWL LRG LVL3 (GOWN DISPOSABLE) ×1
GOWN STRL REUS W/TWL XL LVL3 (GOWN DISPOSABLE) ×1
KIT BASIN OR (CUSTOM PROCEDURE TRAY) ×1 IMPLANT
KIT MARKER MARGIN INK (KITS) ×1 IMPLANT
NDL HYPO 25GX1X1/2 BEV (NEEDLE) ×1 IMPLANT
NEEDLE HYPO 25GX1X1/2 BEV (NEEDLE) ×1
NS IRRIG 1000ML POUR BTL (IV SOLUTION) IMPLANT
PACK GENERAL/GYN (CUSTOM PROCEDURE TRAY) ×1 IMPLANT
SUT MNCRL AB 4-0 PS2 18 (SUTURE) ×1 IMPLANT
SUT VIC AB 3-0 SH 18 (SUTURE) ×1 IMPLANT
SYR CONTROL 10ML LL (SYRINGE) ×1 IMPLANT
TOWEL GREEN STERILE (TOWEL DISPOSABLE) ×1 IMPLANT
TOWEL GREEN STERILE FF (TOWEL DISPOSABLE) ×1 IMPLANT

## 2023-02-14 NOTE — Anesthesia Procedure Notes (Signed)
Procedure Name: LMA Insertion Date/Time: 02/14/2023 8:38 AM  Performed by: [place], Clydene Pugh, CRNAPre-anesthesia Checklist: Patient identified, Emergency Drugs available, Suction available and Patient being monitored Patient Re-evaluated:Patient Re-evaluated prior to induction Oxygen Delivery Method: Circle system utilized Preoxygenation: Pre-oxygenation with 100% oxygen Induction Type: IV induction Ventilation: Mask ventilation without difficulty LMA: LMA inserted LMA Size: 4.0 Number of attempts: 1 Airway Equipment and Method: Bite block Placement Confirmation: positive ETCO2 Tube secured with: Tape Dental Injury: Teeth and Oropharynx as per pre-operative assessment

## 2023-02-14 NOTE — Anesthesia Postprocedure Evaluation (Signed)
Anesthesia Post Note  Patient: Vanessa Hopkins  Procedure(s) Performed: RADIOACTIVE SEED GUIDED LUMPECTOMY (Right: Breast)     Patient location during evaluation: PACU Anesthesia Type: General Level of consciousness: awake Pain management: pain level controlled Vital Signs Assessment: post-procedure vital signs reviewed and stable Respiratory status: spontaneous breathing, nonlabored ventilation and respiratory function stable Cardiovascular status: blood pressure returned to baseline and stable Postop Assessment: no apparent nausea or vomiting Anesthetic complications: no   No notable events documented.  Last Vitals:  Vitals:   02/14/23 0945 02/14/23 0948  BP: 127/81   Pulse: 76 76  Resp: 13 12  Temp:  36.5 C  SpO2: 94% 94%    Last Pain:  Vitals:   02/14/23 0948  PainSc: 0-No pain                 Linton Rump

## 2023-02-14 NOTE — Transfer of Care (Signed)
Immediate Anesthesia Transfer of Care Note  Patient: Jahnya Trindade Lupinski  Procedure(s) Performed: RADIOACTIVE SEED GUIDED LUMPECTOMY (Right)  Patient Location: PACU  Anesthesia Type:General  Level of Consciousness: awake, alert , and oriented  Airway & Oxygen Therapy: Patient Spontanous Breathing  Post-op Assessment: Report given to RN and Post -op Vital signs reviewed and stable  Post vital signs: Reviewed and stable  Last Vitals:  Vitals Value Taken Time  BP 125/69 02/14/23 0918  Temp    Pulse 88 02/14/23 0920  Resp 17 02/14/23 0920  SpO2 95 % 02/14/23 0920  Vitals shown include unfiled device data.  Last Pain:  Vitals:   02/14/23 0715  PainSc: 0-No pain         Complications: No notable events documented.

## 2023-02-14 NOTE — Op Note (Signed)
   Vanessa Hopkins 02/14/2023   Pre-op Diagnosis: RIGHT BREAST COMPLEX SCLEROSING LESION     Post-op Diagnosis: same  Procedure(s): RADIOACTIVE SEED GUIDED RIGHT BREAST  LUMPECTOMY  Surgeon(s): Abigail Miyamoto, MD  Anesthesia: General  Staff:  Circulator: Tawny Hopping, RN Scrub Person: Madilyn Fireman, Amy E  Estimated Blood Loss: Minimal               Specimens: sent to path         Indications: This is a 57 year old female was found to have a small distortion in her right breast on screening mammography.  She has a family history of breast cancer.  A biopsy on this area showed a complex sclerosing lesion.  A radioactive seed guided right breast lumpectomy was recommended  Procedure: The patient was brought to the operating identifies correct patient.  She was placed upon the operating table and general anesthesia was induced.  The patient's right breast was then prepped and draped in the usual sterile fashion.  I located the radioactive seed in the upper right breast near the outer quadrant adjacent to the nipple areolar complex with the neoprobe.  I anesthetized the edge of the areola with Marcaine and then made an incision with a scalpel.  With the neoprobe I then dissected toward the signal from radioactive seed with the aid of the neoprobe.  Once I reached the area stayed widely around it with electrocautery and then completed the lumpectomy coming posterior to the signal.  I took the lumpectomy specimen and marked all margins with paint.  An x-ray was performed on the lumpectomy specimen confirming that the radioactive seed and previous biopsy clip were in the specimen.  The lumpectomy specimen was then sent to pathology for evaluation.  I achieved hemostasis with the cautery.  I injected further Marcaine into the incision.  I then closed the subcutaneous tissue with interrupted 3-0 Vicryl sutures and closed the skin with a running 4-0 Monocryl.  Dermabond was then applied.  The patient  tolerated the procedure well.  All the counts were correct at the end of the procedure.  The patient was then extubated in the operating room and taken in stable condition to the recovery room.  Abigail Miyamoto   Date: 02/14/2023  Time: 9:12 AM

## 2023-02-14 NOTE — Discharge Instructions (Signed)
[facility] [facility] Office Phone Number [phone]  BREAST BIOPSY/ PARTIAL MASTECTOMY: POST OP INSTRUCTIONS  Always review your discharge instruction sheet given to you by the facility where your surgery was performed.  IF YOU HAVE DISABILITY OR FAMILY LEAVE FORMS, YOU MUST BRING THEM TO THE OFFICE FOR PROCESSING.  DO NOT GIVE THEM TO YOUR DOCTOR.  A prescription for pain medication Spilker be given to you upon discharge.  Take your pain medication as prescribed, if needed.  If narcotic pain medicine is not needed, then you Buttacavoli take acetaminophen (Tylenol) or ibuprofen (Advil) as needed. Take your usually prescribed medications unless otherwise directed If you need a refill on your pain medication, please contact your pharmacy.  They will contact our office to request authorization.  Prescriptions will not be filled after 5pm or on week-ends. You should eat very light the first 24 hours after surgery, such as soup, crackers, pudding, etc.  Resume your normal diet the day after surgery. Most patients will experience some swelling and bruising in the breast.  Ice packs and a good support bra will help.  Swelling and bruising can take several days to resolve.  It is common to experience some constipation if taking pain medication after surgery.  Increasing fluid intake and taking a stool softener will usually help or prevent this problem from occurring.  A mild laxative (Milk of Magnesia or Miralax) should be taken according to package directions if there are no bowel movements after 48 hours. Unless discharge instructions indicate otherwise, you Ra remove your bandages 24-48 hours after surgery, and you Pennino shower at that time.  You Six have steri-strips (small skin tapes) in place directly over the incision.  These strips should be left on the skin for 7-10 days.  If your surgeon used skin glue on the incision, you Feijoo shower in 24 hours.  The glue will flake off over the next 2-3 weeks.  Any  sutures or staples will be removed at the office during your follow-up visit. ACTIVITIES:  You Sliter resume regular daily activities (gradually increasing) beginning the next day.  Wearing a good support bra or sports bra minimizes pain and swelling.  You Blatt have sexual intercourse when it is comfortable. You Kirschenmann drive when you no longer are taking prescription pain medication, you can comfortably wear a seatbelt, and you can safely maneuver your car and apply brakes. RETURN TO WORK:  ______________________________________________________________________________________ Bonita Quin should see your doctor in the office for a follow-up appointment approximately two weeks after your surgery.  Your doctor's nurse will typically make your follow-up appointment when she calls you with your pathology report.  Expect your pathology report 2-3 business days after your surgery.  You Tregre call to check if you do not hear from [place] after three days. OTHER INSTRUCTIONS: _WEAR WHAT EVER MAKES YOU THE MOST COMFORTABLE ICE PACK, TYLENOL, AND IBUPROFEN ALSO FOR PAIN NO VIGOROUS ACTIVITY FOR ONE WEEK ______________________________________________________________________________________________ _____________________________________________________________________________________________________________________________________ _____________________________________________________________________________________________________________________________________ _____________________________________________________________________________________________________________________________________  WHEN TO CALL YOUR DOCTOR: Fever over 101.0 Nausea and/or vomiting. Extreme swelling or bruising. Continued bleeding from incision. Increased pain, redness, or drainage from the incision.  The clinic staff is available to answer your questions during regular business hours.  Please don't hesitate to call and ask to speak to one of the nurses  for clinical concerns.  If you have a medical emergency, go to the nearest emergency room or call 911.  A surgeon from [facility] is always on call at the hospital.  For further  questions, please visit [email or web address]

## 2023-02-14 NOTE — Interval H&P Note (Signed)
History and Physical Interval Note:no change in H and P  02/14/2023 7:59 AM  [name]  has presented today for surgery, with the diagnosis of RIGHT BREAST COMPLEX SCLEROSING LESION.  The various methods of treatment have been discussed with the patient and family. After consideration of risks, benefits and other options for treatment, the patient has consented to  Procedure(s) with comments: RADIOACTIVE SEED GUIDED LUMPECTOMY (Right) - LMA as a surgical intervention.  The patient's history has been reviewed, patient examined, no change in status, stable for surgery.  I have reviewed the patient's chart and labs.  Questions were answered to the patient's satisfaction.     Vanessa Hopkins

## 2023-02-15 ENCOUNTER — Encounter (HOSPITAL_COMMUNITY): Payer: Self-pay | Admitting: Surgery

## 2023-02-15 LAB — SURGICAL PATHOLOGY

## 2023-02-19 ENCOUNTER — Other Ambulatory Visit: Payer: Self-pay

## 2023-02-19 DIAGNOSIS — Z79890 Hormone replacement therapy (postmenopausal): Secondary | ICD-10-CM

## 2023-02-19 MED ORDER — PROGESTERONE MICRONIZED 100 MG PO CAPS
100.0000 mg | ORAL_CAPSULE | Freq: Every day | ORAL | 0 refills | Status: DC
Start: 2023-02-19 — End: 2023-03-16

## 2023-02-19 NOTE — Telephone Encounter (Signed)
Per med refill request 01/27/2023.  Pt calling to state that her pathology results came back WNL from her breast procedure and she wishes to restart her HRT asap and will need this refill on her progesterone. Good on estrogen patches for now.  Med refill request: Progesterone Last AEX: 12/20/2021-ML Next AEX: 03/16/2023-JC Last MMG (if hormonal med): Lumpectomy 02/14/2023, Lt Breast f/u 12/19/2022-fibrocystic changes. Refill authorized: rx pend.

## 2023-02-20 NOTE — Telephone Encounter (Signed)
LDVM per DPR letting her know that refill was sent.

## 2023-02-24 ENCOUNTER — Other Ambulatory Visit: Payer: Self-pay | Admitting: Obstetrics & Gynecology

## 2023-02-24 DIAGNOSIS — Z79890 Hormone replacement therapy (postmenopausal): Secondary | ICD-10-CM

## 2023-02-26 NOTE — Telephone Encounter (Signed)
Med refill request: Estradiol Patch Last AEX: 12/20/2021-ML Next AEX: 03/16/2023-JC Last MMG (if hormonal med): recent breast bx on 02/14/2023-neg for malignancy Refill authorized: rx pend.

## 2023-03-15 ENCOUNTER — Other Ambulatory Visit: Payer: Self-pay | Admitting: Obstetrics and Gynecology

## 2023-03-15 DIAGNOSIS — Z79890 Hormone replacement therapy (postmenopausal): Secondary | ICD-10-CM

## 2023-03-15 NOTE — Telephone Encounter (Signed)
Patient has appointment with [facility] tomorrow.

## 2023-03-16 ENCOUNTER — Ambulatory Visit (INDEPENDENT_AMBULATORY_CARE_PROVIDER_SITE_OTHER): Payer: Managed Care, Other (non HMO) | Admitting: Radiology

## 2023-03-16 ENCOUNTER — Other Ambulatory Visit (HOSPITAL_COMMUNITY)
Admission: RE | Admit: 2023-03-16 | Discharge: 2023-03-16 | Disposition: A | Discharge status: Home / Self Care | Payer: Managed Care, Other (non HMO) | Source: Ambulatory Visit | Attending: Radiology | Admitting: Radiology

## 2023-03-16 ENCOUNTER — Encounter: Payer: Self-pay | Admitting: Radiology

## 2023-03-16 VITALS — BP 124/72 | Ht 66.75 in | Wt 190.0 lb

## 2023-03-16 DIAGNOSIS — Z01419 Encounter for gynecological examination (general) (routine) without abnormal findings: Secondary | ICD-10-CM

## 2023-03-16 DIAGNOSIS — N958 Other specified menopausal and perimenopausal disorders: Secondary | ICD-10-CM

## 2023-03-16 DIAGNOSIS — Z79890 Hormone replacement therapy (postmenopausal): Secondary | ICD-10-CM

## 2023-03-16 DIAGNOSIS — Z1211 Encounter for screening for malignant neoplasm of colon: Secondary | ICD-10-CM

## 2023-03-16 DIAGNOSIS — D241 Benign neoplasm of right breast: Secondary | ICD-10-CM | POA: Diagnosis not present

## 2023-03-16 MED ORDER — ESTRADIOL 0.0375 MG/24HR TD PTTW: 1.0000 | MEDICATED_PATCH | TRANSDERMAL | 4 refills | Status: DC

## 2023-03-16 MED ORDER — IMVEXXY MAINTENANCE PACK 10 MCG VA INST
1.0000 | VAGINAL_INSERT | VAGINAL | 11 refills | Status: DC
Start: 2023-03-19 — End: 2023-06-28

## 2023-03-16 MED ORDER — PROGESTERONE MICRONIZED 100 MG PO CAPS: 100.0000 mg | ORAL_CAPSULE | Freq: Every day | ORAL | 4 refills | Status: DC

## 2023-03-16 NOTE — Patient Instructions (Signed)
Preventive Care 57-57 Years Old, Female Preventive care refers to lifestyle choices and visits with your health care provider that can promote health and wellness. Preventive care visits are also called wellness exams. What can I expect for my preventive care visit? Counseling Your health care provider may ask you questions about your: Medical history, including: Past medical problems. Family medical history. Pregnancy history. Current health, including: Menstrual cycle. Method of birth control. Emotional well-being. Home life and relationship well-being. Sexual activity and sexual health. Lifestyle, including: Alcohol, nicotine or tobacco, and drug use. Access to firearms. Diet, exercise, and sleep habits. Work and work environment. Sunscreen use. Safety issues such as seatbelt and bike helmet use. Physical exam Your health care provider will check your: Height and weight. These may be used to calculate your BMI (body mass index). BMI is a measurement that tells if you are at a healthy weight. Waist circumference. This measures the distance around your waistline. This measurement also tells if you are at a healthy weight and may help predict your risk of certain diseases, such as type 2 diabetes and high blood pressure. Heart rate and blood pressure. Body temperature. Skin for abnormal spots. What immunizations do I need?  Vaccines are usually given at various ages, according to a schedule. Your health care provider will recommend vaccines for you based on your age, medical history, and lifestyle or other factors, such as travel or where you work. What tests do I need? Screening Your health care provider may recommend screening tests for certain conditions. This may include: Lipid and cholesterol levels. Diabetes screening. This is done by checking your blood sugar (glucose) after you have not eaten for a while (fasting). Pelvic exam and Pap test. Hepatitis B test. Hepatitis C  test. HIV (human immunodeficiency virus) test. STI (sexually transmitted infection) testing, if you are at risk. Lung cancer screening. Colorectal cancer screening. Mammogram. Talk with your health care provider about when you should start having regular mammograms. This may depend on whether you have a family history of breast cancer. BRCA-related cancer screening. This may be done if you have a family history of breast, ovarian, tubal, or peritoneal cancers. Bone density scan. This is done to screen for osteoporosis. Talk with your health care provider about your test results, treatment options, and if necessary, the need for more tests. Follow these instructions at home: Eating and drinking  Eat a diet that includes fresh fruits and vegetables, whole grains, lean protein, and low-fat dairy products. Take vitamin and mineral supplements as recommended by your health care provider. Do not drink alcohol if: Your health care provider tells you not to drink. You are pregnant, may be pregnant, or are planning to become pregnant. If you drink alcohol: Limit how much you have to 0-1 drink a day. Know how much alcohol is in your drink. In the [place] one drink equals one 12 oz bottle of beer (355 mL), one 5 oz glass of wine (148 mL), or one 1 oz glass of hard liquor (44 mL). Lifestyle Brush your teeth every morning and night with fluoride toothpaste. Floss one time each day. Exercise for at least 30 minutes 5 or more days each week. Do not use any products that contain nicotine or tobacco. These products include cigarettes, chewing tobacco, and vaping devices, such as e-cigarettes. If you need help quitting, ask your health care provider. Do not use drugs. If you are sexually active, practice safe sex. Use a condom or other form of protection to   prevent STIs. If you do not wish to become pregnant, use a form of birth control. If you plan to become pregnant, see your health care provider for a  prepregnancy visit. Take aspirin only as told by your health care provider. Make sure that you understand how much to take and what form to take. Work with your health care provider to find out whether it is safe and beneficial for you to take aspirin daily. Find healthy ways to manage stress, such as: Meditation, yoga, or listening to music. Journaling. Talking to a trusted person. Spending time with friends and family. Minimize exposure to UV radiation to reduce your risk of skin cancer. Safety Always wear your seat belt while driving or riding in a vehicle. Do not drive: If you have been drinking alcohol. Do not ride with someone who has been drinking. When you are tired or distracted. While texting. If you have been using any mind-altering substances or drugs. Wear a helmet and other protective equipment during sports activities. If you have firearms in your house, make sure you follow all gun safety procedures. Seek help if you have been physically or sexually abused. What's next? Visit your health care provider once a year for an annual wellness visit. Ask your health care provider how often you should have your eyes and teeth checked. Stay up to date on all vaccines. This information is not intended to replace advice given to you by your health care provider. Make sure you discuss any questions you have with your health care provider. Document Revised: 10/20/2020 Document Reviewed: 10/20/2020 Elsevier Patient Education  2024 [employer]  

## 2023-03-16 NOTE — Progress Notes (Signed)
Vanessa Hopkins [birth year] [id number]   History: Postmenopausal 57 y.o. presents for annual exam. 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 Hx of fibroadenoma. Doing well. C/o vaginal dryness. Would like to increase estrogen patch- had decreased but symptoms returned. Has a new PCP. Has not had colon cancer screening yet. No other gyn concerns.   Gynecologic History Postmenopausal Last Pap: 2021. Results were: normal Last mammogram: 8/24. Results were: normal Last colonoscopy: never   Obstetric History OB History  Gravida Para Term Preterm AB Living  4 4       4   SAB IAB Ectopic Multiple Live Births               # Outcome Date GA Lbr Len/2nd Weight Sex Type Anes PTL Lv  4 Para           3 Para           2 Para           1 Para             Obstetric Comments  1st Menstrual Cycle:  12  1st Pregnancy:  22     The following portions of the patient's history were reviewed and updated as appropriate: allergies, current medications, past family history, past medical history, past social history, past surgical history, and problem list.  Review of Systems Pertinent items noted in HPI and remainder of comprehensive ROS otherwise negative.  Past medical history, past surgical history, family history and social history were all reviewed and documented in the EPIC chart.  Exam:  Vitals:   03/16/23 0807  BP: 124/72  Weight: 190 lb (86.2 kg)  Height: 5' 6.75" (1.695 m)   Body mass index is 29.98 kg/m.  General appearance:  Normal Thyroid:  Symmetrical, normal in size, without palpable masses or nodularity. Respiratory  Auscultation:  Clear without wheezing or rhonchi Cardiovascular  Auscultation:  Regular rate, without rubs, murmurs or gallops  Edema/varicosities:  Not grossly evident Abdominal  Soft,nontender, without masses, guarding or rebound.  Liver/spleen:  No organomegaly noted  Hernia:  None appreciated  Skin  Inspection:  Grossly  normal Breasts: Examined lying and sitting.   Right: Lumpectomy scar around nipple, healing well. Otherwise without masses, retractions, nipple discharge or axillary adenopathy.   Left: Without masses, retractions, nipple discharge or axillary adenopathy. Genitourinary   Inguinal/mons:  Normal without inguinal adenopathy  External genitalia:  Normal appearing vulva with no masses, tenderness, or lesions  BUS/Urethra/Skene's glands:  Normal  Vagina:  Normal appearing with normal color and discharge, no lesions. Atrophy: mild   Cervix:  Normal appearing without discharge or lesions  Uterus:  Normal in size, shape and contour.  Midline and mobile, nontender  Adnexa/parametria:     Rt: Normal in size, without masses or tenderness.   Lt: Normal in size, without masses or tenderness.  Anus and perineum: Normal    Vanessa Hopkins, CMA present for exam  Assessment/Plan:   1. Well woman exam with routine gynecological exam - Cytology - PAP( [place])  2. Postmenopausal hormone replacement therapy Would like to continue progesterone but increase estrogen- had decreased but was not happy with symptom relief - progesterone (PROMETRIUM) 100 MG capsule; Take 1 capsule (100 mg total) by mouth at bedtime.  Dispense: 90 capsule; Refill: 4 - estradiol (VIVELLE-DOT) 0.0375 MG/24HR; Place 1 patch onto the skin 2 (two) times a week.  Dispense: 24 patch; Refill: 4  3. Fibroadenoma of right  breast Lumpectomy with seed placement , doing well.  4. Genitourinary syndrome of menopause - Estradiol (IMVEXXY MAINTENANCE PACK) 10 MCG INST; Place 1 tablet vaginally 2 (two) times a week.  Dispense: 8 each; Refill: 11  5. Colon cancer screening - Cologuard    Discussed SBE, colonoscopy and DEXA screening as directed. Recommend of exercise weekly, including weight bearing exercise. Encouraged the use of seatbelts and sunscreen.  Return in 1 year for annual or sooner prn.  Vanessa Hopkins WHNP-BC,  9:03 AM 03/16/2023

## 2023-03-20 LAB — CYTOLOGY - PAP
Comment: NEGATIVE
Diagnosis: NEGATIVE
High risk HPV: NEGATIVE

## 2023-04-07 LAB — COLOGUARD: COLOGUARD: NEGATIVE

## 2023-05-24 ENCOUNTER — Other Ambulatory Visit: Payer: Self-pay | Admitting: Radiology

## 2023-05-24 DIAGNOSIS — Z79890 Hormone replacement therapy (postmenopausal): Secondary | ICD-10-CM

## 2023-05-24 NOTE — Telephone Encounter (Signed)
Med refill request: estradiol 0.025mg  patch RX was changed to estradiol 0.0375mg  patch 03/16/23 Last AEX: 03/16/23 Next AEX: none scheduled Last MMG (if hormonal med) 12/19/22 Refill denied and sent to provider for review.

## 2023-06-28 DIAGNOSIS — N958 Other specified menopausal and perimenopausal disorders: Secondary | ICD-10-CM

## 2023-06-28 MED ORDER — IMVEXXY MAINTENANCE PACK 10 MCG VA INST
1.0000 | VAGINAL_INSERT | VAGINAL | 8 refills | Status: DC
Start: 2023-06-28 — End: 2024-03-25

## 2023-06-28 NOTE — Telephone Encounter (Signed)
 Pls review rx/pharmacy then approve if all correct/corrected. Thanks.

## 2023-06-28 NOTE — Telephone Encounter (Signed)
 Per imvexxy savings site: If the pt could get it approved through insurance and rx was sent to [facility], pt could get it as low as $25 OOP costs but the $50 is the self-pay cost.   Per EMR: unable to locate if a PA was initiated or attempted.   Please advise.

## 2023-06-28 NOTE — Telephone Encounter (Signed)
 You can try sending it to [facility] prescription services in [place]. They are currently contracted with that company with the lowest prices available.

## 2024-03-13 IMAGING — MG MM DIGITAL SCREENING BILAT W/ TOMO AND CAD
8 series · 8 of 24 positions shown · non-contrast
Comparison: Previous exam(s).

CLINICAL DATA: Screening.

EXAM:
DIGITAL SCREENING BILATERAL MAMMOGRAM WITH TOMOSYNTHESIS AND CAD
TECHNIQUE: Bilateral screening digital craniocaudal and mediolateral oblique
mammograms were obtained. Bilateral screening digital breast
tomosynthesis was performed. The images were evaluated with
computer-aided detection.

[R CC synth-2D]
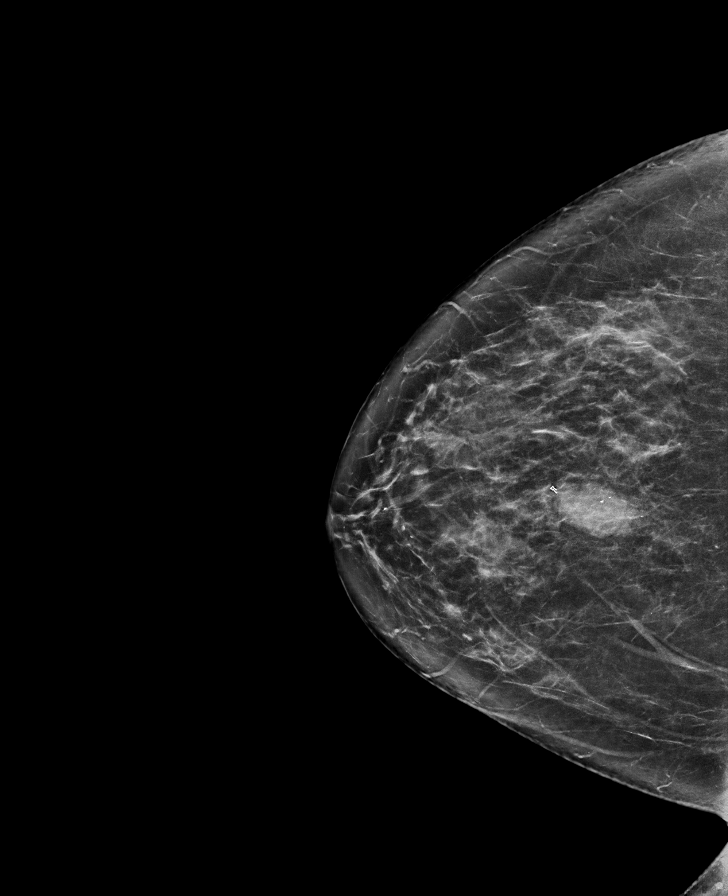

[L CC synth-2D]
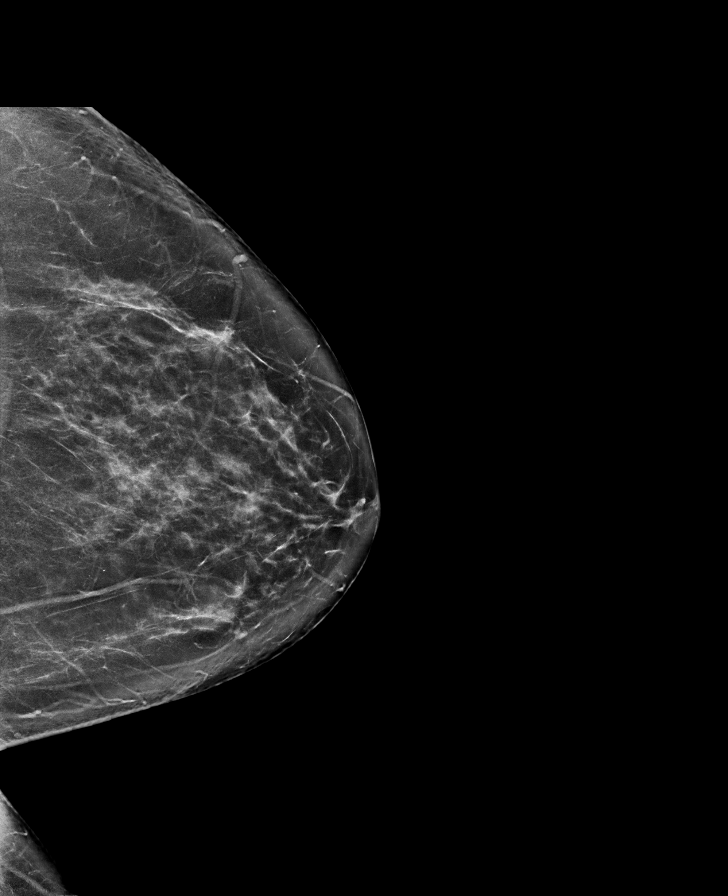

[R MLO synth-2D]
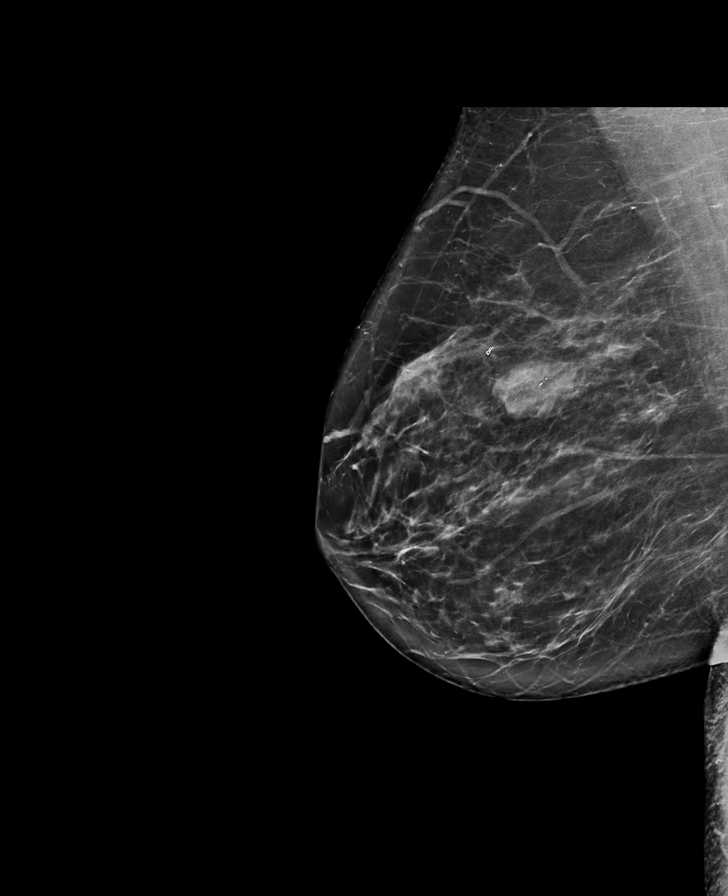

[L MLO synth-2D]
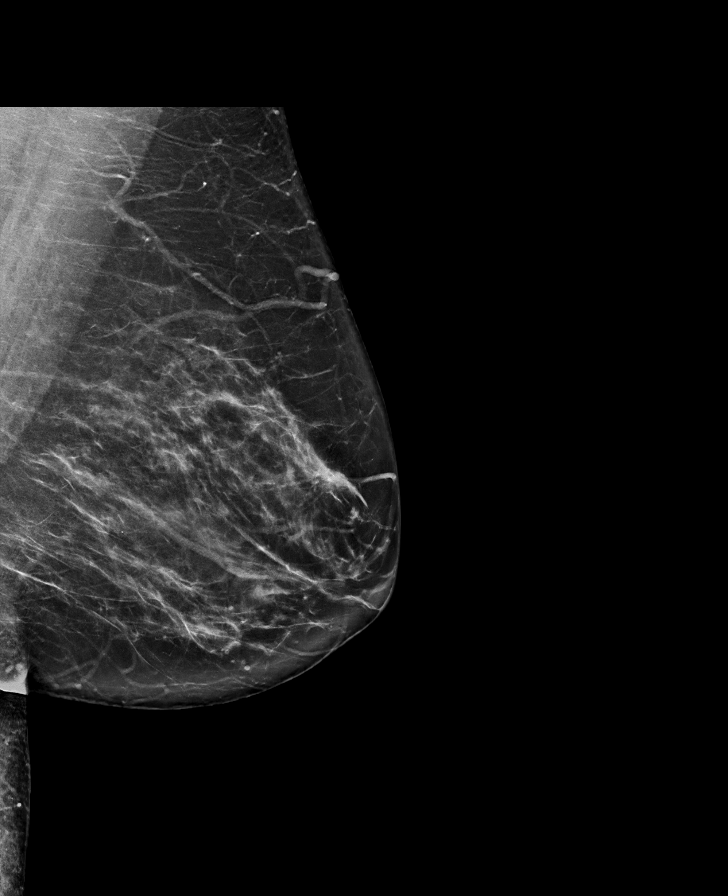

[L CC tomo · tomo slice 41/81.0]
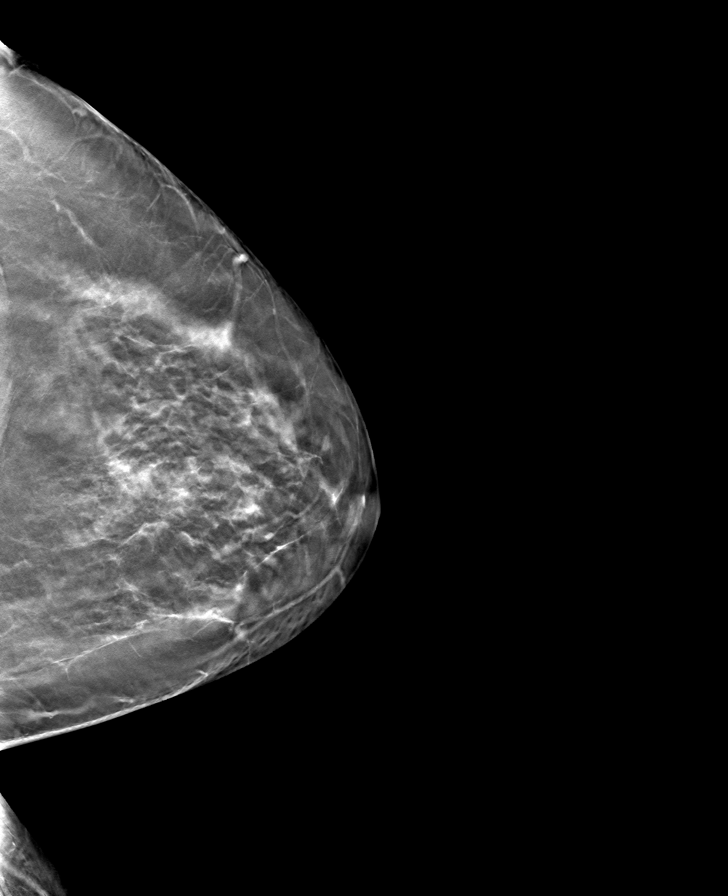

[R CC tomo · tomo slice 39/78.0]
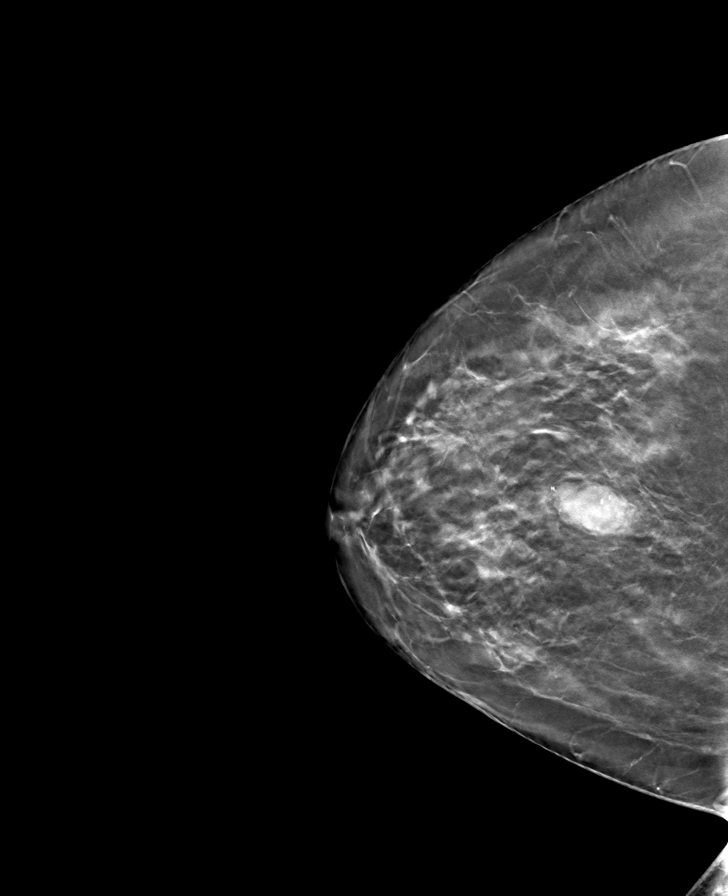

[L MLO tomo · tomo slice 41/82.0]
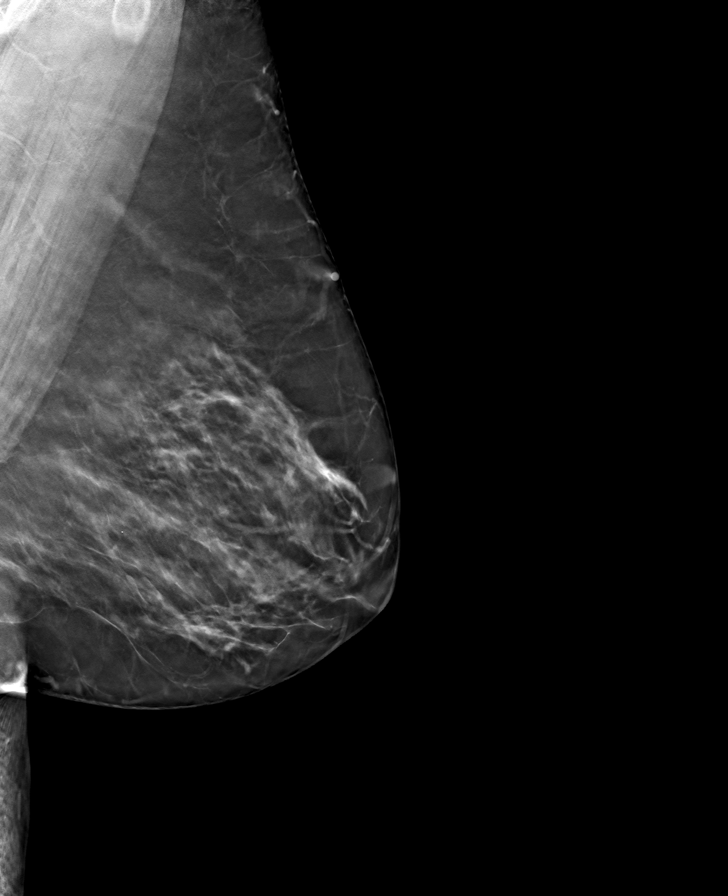

[R MLO tomo · tomo slice 41/81.0]
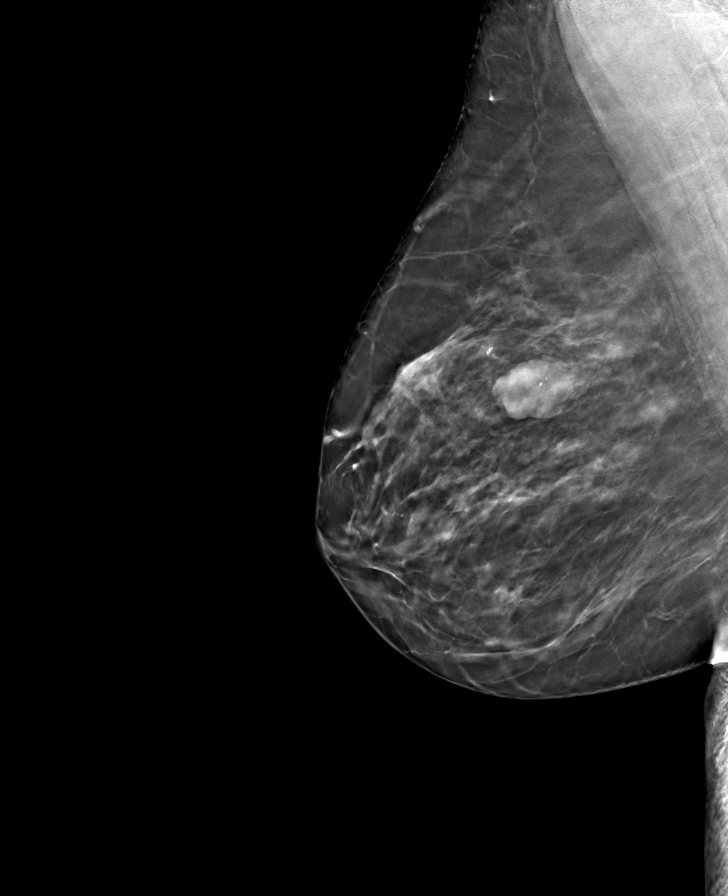

[8 of 24 positions shown; findings below may reference images not displayed]

ACR Breast Density Category b: There are scattered areas of
fibroglandular density.
FINDINGS: There are no findings suspicious for malignancy.
IMPRESSION: No mammographic evidence of malignancy. A result letter of this
screening mammogram will be mailed directly to the patient.

RECOMMENDATION:
Screening mammogram in one year. (Code:51-O-LD2)

BI-RADS CATEGORY  1: Negative.

## 2024-03-24 ENCOUNTER — Other Ambulatory Visit: Payer: Self-pay | Admitting: Radiology

## 2024-03-24 DIAGNOSIS — N958 Other specified menopausal and perimenopausal disorders: Secondary | ICD-10-CM

## 2024-03-25 NOTE — Telephone Encounter (Signed)
 Med refill request: *Estradiol  (IMVEXXY  MAINTENANCE PACK) 10 MCG INST  Start:  06/28/23 Disp:   8 Refills:  8  Last AEX:  03/16/23 Next AEX:  05/13/24 Last MMG (if hormonal med):  12/19/22 Refill authorized? Please Advise.

## 2024-03-26 ENCOUNTER — Ambulatory Visit: Admitting: Radiology

## 2024-04-18 ENCOUNTER — Other Ambulatory Visit: Payer: Self-pay | Admitting: Nurse Practitioner

## 2024-04-18 DIAGNOSIS — N958 Other specified menopausal and perimenopausal disorders: Secondary | ICD-10-CM

## 2024-04-21 NOTE — Telephone Encounter (Signed)
 Med refill request: IMVEXXY  MAINTENANCE PACK 10 MCG INST  Start:  03/25/24 Disp:   8 each Refills:  0  Last AEX:  03/16/23 Next AEX:  05/13/24 Last MMG (if hormonal med):  12/19/22 Refill authorized? Please Advise.

## 2024-05-09 ENCOUNTER — Other Ambulatory Visit: Payer: Self-pay | Admitting: Radiology

## 2024-05-09 DIAGNOSIS — Z79890 Hormone replacement therapy (postmenopausal): Secondary | ICD-10-CM

## 2024-05-09 NOTE — Telephone Encounter (Signed)
 Med refill request:   estradiol  (VIVELLE -DOT) 0.0375 MG/24HR  Start:  03/19/23 Disp:  24 patches Refills:  4  Last AEX:  03/16/23 Next AEX:  05/13/24 Last MMG (if hormonal med):  12/19/22 Refill authorized? Please Advise.

## 2024-05-13 ENCOUNTER — Ambulatory Visit: Admitting: Radiology

## 2024-05-13 ENCOUNTER — Encounter: Payer: Self-pay | Admitting: Radiology

## 2024-05-13 VITALS — BP 122/84 | HR 81 | Ht 66.5 in | Wt 194.0 lb

## 2024-05-13 DIAGNOSIS — Z01419 Encounter for gynecological examination (general) (routine) without abnormal findings: Secondary | ICD-10-CM | POA: Diagnosis not present

## 2024-05-13 DIAGNOSIS — Z9189 Other specified personal risk factors, not elsewhere classified: Secondary | ICD-10-CM | POA: Diagnosis not present

## 2024-05-13 DIAGNOSIS — Z79890 Hormone replacement therapy (postmenopausal): Secondary | ICD-10-CM | POA: Diagnosis not present

## 2024-05-13 DIAGNOSIS — Z1331 Encounter for screening for depression: Secondary | ICD-10-CM

## 2024-05-13 DIAGNOSIS — N958 Other specified menopausal and perimenopausal disorders: Secondary | ICD-10-CM | POA: Diagnosis not present

## 2024-05-13 MED ORDER — PROGESTERONE MICRONIZED 100 MG PO CAPS: 100.0000 mg | ORAL_CAPSULE | Freq: Every day | ORAL | 4 refills | Status: AC

## 2024-05-13 MED ORDER — ESTRADIOL 10 MCG VA TABS: 1.0000 | ORAL_TABLET | VAGINAL | 4 refills | Status: AC

## 2024-05-13 MED ORDER — ESTRADIOL 0.0375 MG/24HR TD PTTW: 1.0000 | MEDICATED_PATCH | TRANSDERMAL | 4 refills | Status: AC

## 2024-05-13 NOTE — Progress Notes (Signed)
 "  Vanessa Hopkins 11/25/1965 [id number]   History: Postmenopausal 59 y.o. presents for annual exam. 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 Doing well on HRT, would like to switch vaginal preparation to a more affordable option. Fracture of leg in 2025, mother with osteoporosis, would like DEXA.   Gynecologic History Postmenopausal Last Pap: 11/24. Results were: normal Last mammogram: 12/19/22. Results were: abnormal Last colonoscopy: 03/29/23 cologuard negative DEXA:never HRT use: current  Obstetric History OB History  Gravida Para Term Preterm AB Living  4 4    4   SAB IAB Ectopic Multiple Live Births          # Outcome Date GA Lbr Len/2nd Weight Sex Type Anes PTL Lv  4 Para           3 Para           2 Para           1 Para             Obstetric Comments  1st Menstrual Cycle:  12  1st Pregnancy:  22       05/13/2024   10:25 AM  Depression screen PHQ 2/9  Decreased Interest 0  Down, Depressed, Hopeless 0  PHQ - 2 Score 0     The following portions of the patient's history were reviewed and updated as appropriate: allergies, current medications, past family history, past medical history, past social history, past surgical history, and problem list.  Review of Systems Pertinent items noted in HPI and remainder of comprehensive ROS otherwise negative.  Past medical history, past surgical history, family history and social history were all reviewed and documented in the EPIC chart.  Exam:  Vitals:   05/13/24 1023  BP: 122/84  Pulse: 81  SpO2: 99%  Weight: 194 lb (88 kg)  Height: 5' 6.5 (1.689 m)   Body mass index is 30.84 kg/m.  General appearance:  Normal Thyroid:  Symmetrical, normal in size, without palpable masses or nodularity. Respiratory  Auscultation:  Clear without wheezing or rhonchi Cardiovascular  Auscultation:  Regular rate, without rubs, murmurs or gallops  Edema/varicosities:  Not grossly  evident Abdominal  Soft,nontender, without masses, guarding or rebound.  Liver/spleen:  No organomegaly noted  Hernia:  None appreciated  Skin  Inspection:  Grossly normal Breasts: Examined lying and sitting.   Right: Without masses, retractions, nipple discharge or axillary adenopathy.   Left: Without masses, retractions, nipple discharge or axillary adenopathy. Genitourinary   Inguinal/mons:  Normal without inguinal adenopathy  External genitalia:  Normal appearing vulva with no masses, tenderness, or lesions  BUS/Urethra/Skene's glands:  Normal  Vagina:  Normal appearing with normal color and discharge, no lesions. Atrophy: improved, mild   Cervix:  Normal appearing without discharge or lesions  Uterus:  Normal in size, shape and contour.  Midline and mobile, nontender  Adnexa/parametria:     Rt: Normal in size, without masses or tenderness.   Lt: Normal in size, without masses or tenderness.  Anus and perineum: Normal    Darice Hoit, CMA present for exam  Assessment/Plan:   1. Well woman exam with routine gynecological exam (Primary) Pap 2027 Cologuard 2027 Schedule overdue mammogram  2. Postmenopausal hormone replacement therapy - estradiol  (VIVELLE -DOT) 0.0375 MG/24HR; Place 1 patch onto the skin 2 (two) times a week.  Dispense: 24 patch; Refill: 4 - progesterone  (PROMETRIUM ) 100 MG capsule; Take 1 capsule (100 mg total) by mouth at bedtime.  Dispense:  90 capsule; Refill: 4  3. At high risk for osteoporosis - DG Bone Density; Future  4. Genitourinary syndrome of menopause - Estradiol  10 MCG TABS vaginal tablet; Place 1 tablet (10 mcg total) vaginally 2 (two) times a week.  Dispense: 24 tablet; Refill: 4  5. Depression screening negative    Return in 1 year for annual or sooner prn.  Stacy Deshler B WHNP-BC, 10:57 AM 05/13/2024 "

## 2024-05-16 ENCOUNTER — Other Ambulatory Visit: Payer: Self-pay | Admitting: Radiology

## 2024-05-16 DIAGNOSIS — Z1231 Encounter for screening mammogram for malignant neoplasm of breast: Secondary | ICD-10-CM

## 2024-06-03 ENCOUNTER — Ambulatory Visit

## 2024-06-10 ENCOUNTER — Other Ambulatory Visit (HOSPITAL_BASED_OUTPATIENT_CLINIC_OR_DEPARTMENT_OTHER)

## 2024-06-12 ENCOUNTER — Ambulatory Visit

## 2024-06-24 ENCOUNTER — Ambulatory Visit

## 2024-07-08 ENCOUNTER — Other Ambulatory Visit (HOSPITAL_BASED_OUTPATIENT_CLINIC_OR_DEPARTMENT_OTHER)
